# Patient Record
Sex: Male | Born: 2000
Health system: Southern US, Community
[De-identification: ages and names within clinical notes are randomized; demographics above are authoritative.]

---
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
## 2021-11-27 IMAGING — CT CT NECK WO/W CM
3 of 12 series · 10 of 34 positions shown, 11 images · IV contrast (omnipaque)
Comparison: None.
COMPARISON: None.

Addendum:
CLINICAL DATA: Fever.  Neck pain.  Assess for infection.

EXAM:
CT NECK WITH AND WITHOUT CONTRAST
TECHNIQUE: Multidetector CT imaging of the neck was performed without and with
intravenous contrast.
CONTRAST:  75mL OMNIPAQUE IOHEXOL 300 MG/ML  SOLN

[Series 7: sagittal · sagittal · 0.53mm/px · 6 of 101 slices shown]
[im 16/101  soft-tissue]
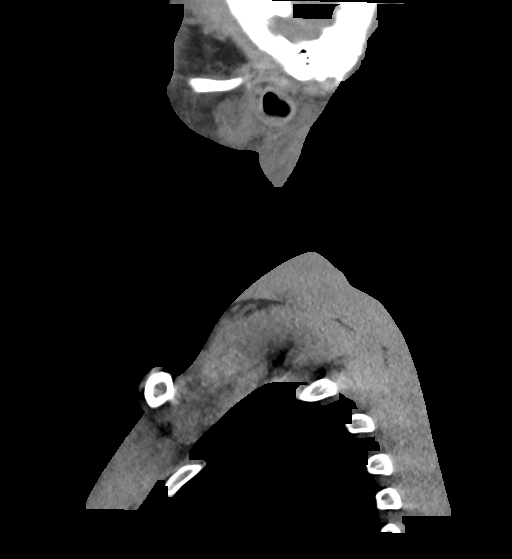
[im 17/101  bone]
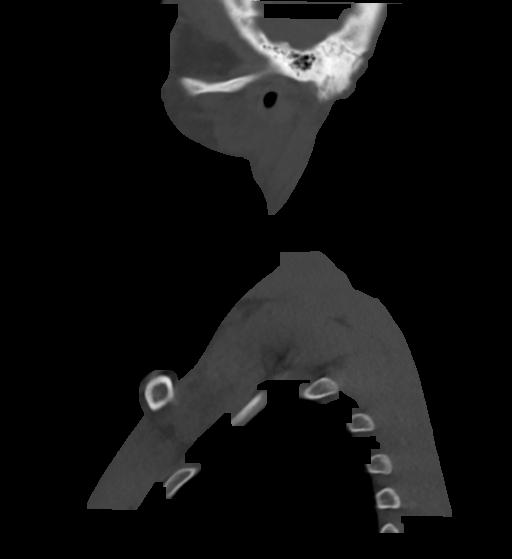
[im 34/101  bone]
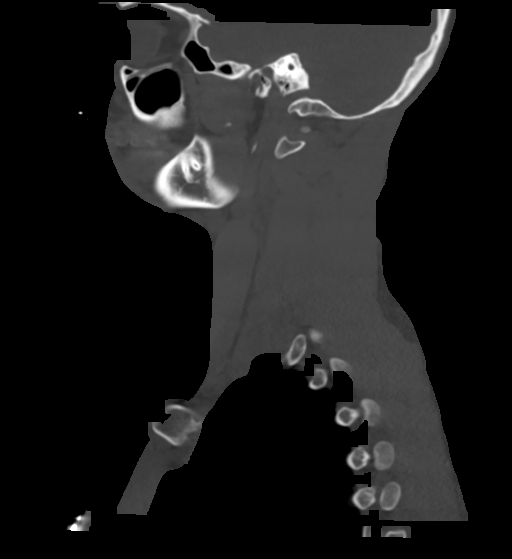
[im 51/101  bone]
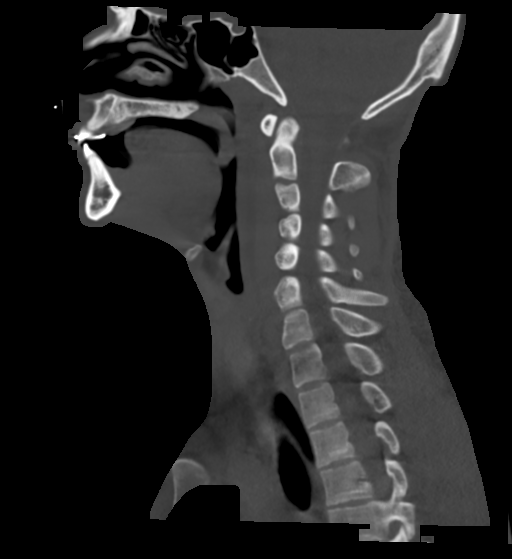
[im 67/101  bone]
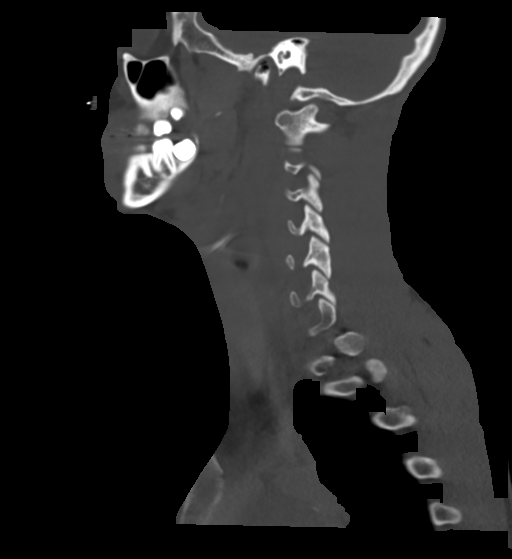
[im 84/101  bone]
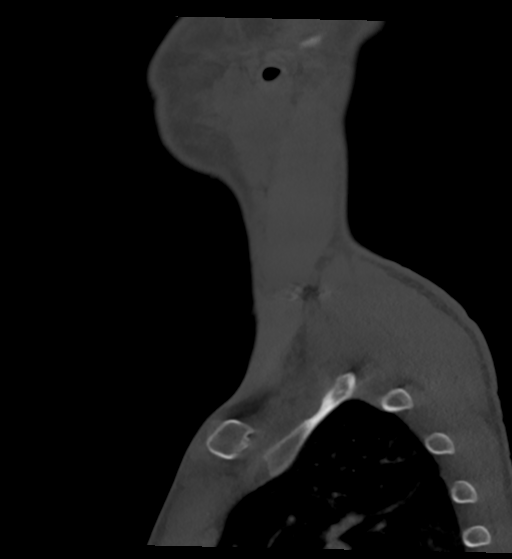

[Series 8: coronal · coronal · 0.39mm/px · 1 of 135 slices shown]
[im 68/135  bone]
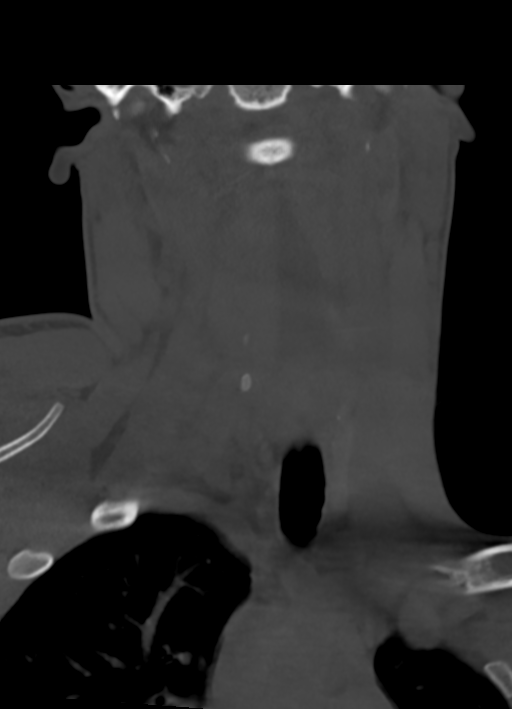

[Series 9: orthogonal · axial · 0.39mm/px · z∈[-398,-104]mm · 3 of 148 slices shown, 4 images]
[im 1/148  soft-tissue]
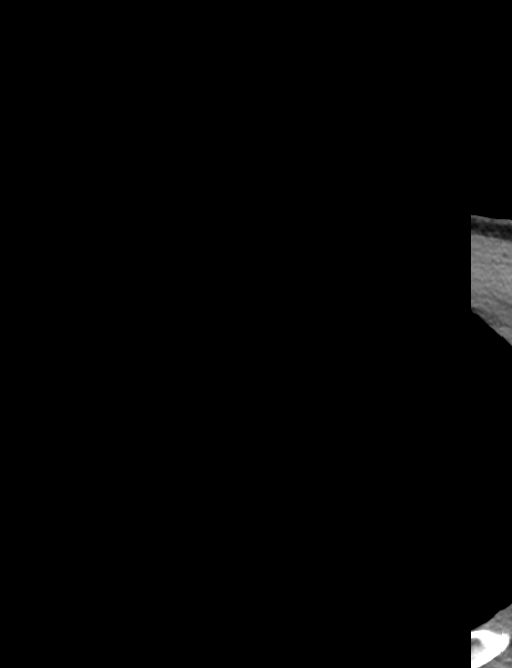
[im 1/148  bone]
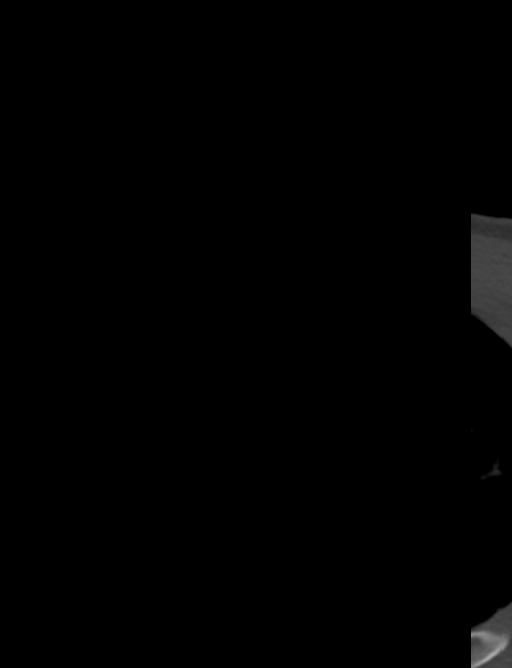
[im 74/148  bone]
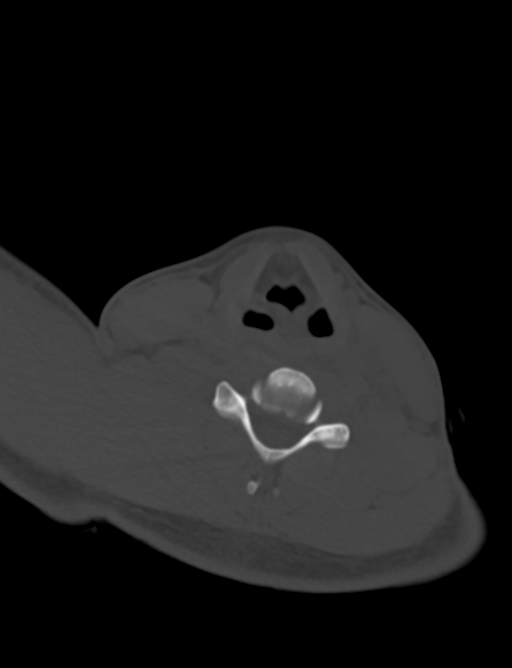
[im 148/148  bone]
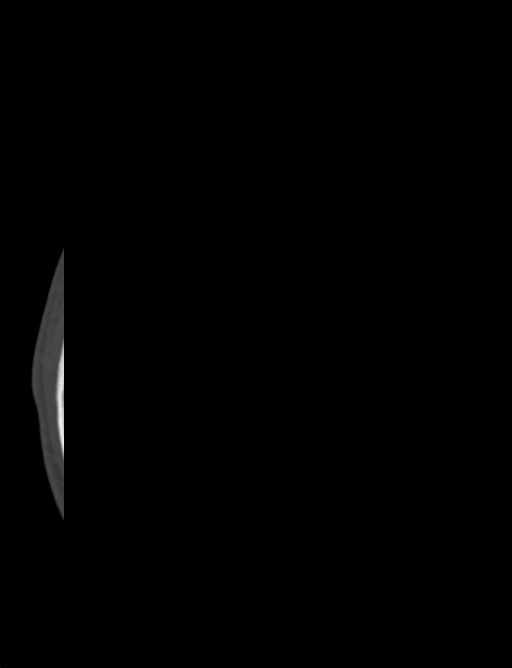

[10 of 34 positions shown; findings below may reference images not displayed]

FINDINGS: Pharynx and larynx: Pharyngitis with retropharyngeal/prevertebral
effusion, possibly pus. No focal collection elsewhere.

Salivary glands: Parotid and submandibular glands are normal.

Thyroid: Normal

Lymph nodes: Mild reactive nodal prominence. No significant
lymphadenopathy.

Vascular: Negative

Limited intracranial: Negative

Visualized orbits: Negative

Mastoids and visualized paranasal sinuses: Clear

Skeleton: Negative

Upper chest: Normal

Other: None
IMPRESSION: Pharyngitis with prevertebral/retropharyngeal effusion, presumably
pus consistent with retropharyngeal abscess formation.

ADDENDUM:
Retropharyngeal fluid collection extends over a length of 8 cm and
has a maximal thickness of 9 mm.

*** End of Addendum ***
FINDINGS: Pharynx and larynx: Pharyngitis with retropharyngeal/prevertebral
effusion, possibly pus. No focal collection elsewhere.

Salivary glands: Parotid and submandibular glands are normal.

Thyroid: Normal

Lymph nodes: Mild reactive nodal prominence. No significant
lymphadenopathy.

Vascular: Negative

Limited intracranial: Negative

Visualized orbits: Negative

Mastoids and visualized paranasal sinuses: Clear

Skeleton: Negative

Upper chest: Normal

Other: None
IMPRESSION: Pharyngitis with prevertebral/retropharyngeal effusion, presumably
pus consistent with retropharyngeal abscess formation.

## 2023-03-30 NOTE — Progress Notes (Signed)
 See Scan note in Media

## 2024-04-30 NOTE — Progress Notes (Signed)
  ANNUAL EXAM  Gary Rojas is a 23 y.o. male  CHIEF COMPLAINT: Chief Complaint  Patient presents with  . New Patient    SUBJECTIVE: 23 year old male presents for evaluation.  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  No complaints.  Has minimal hives on occasion ______________________________________________________________________ A comprehensive ROS was negative in all 10 systems reviewed.  ALLERGIES: Patient has no known allergies.  No past medical history on file.  History reviewed. No pertinent surgical history.  No current outpatient medications on file.   No current facility-administered medications for this visit.    PHYSICAL EXAM: BP 120/70   Pulse 94   Wt 97.2 kg (214 lb 3.2 oz)   SpO2 97%   There is no height or weight on file to calculate BMI.  Wt Readings from Last 2 Encounters:  04/30/24 97.2 kg (214 lb 3.2 oz)  11/14/17 79.8 kg (176 lb) (96%, Z= 1.73)*   * Growth percentiles are based on CDC (Boys, 2-20 Years) data.    BP Readings from Last 2 Encounters:  04/30/24 120/70  11/14/17 106/62 (24%, Z = -0.71 /  34%, Z = -0.41)*   *BP percentiles are based on the 2017 AAP Clinical Practice Guideline for boys    General. Alert oriented x3  Skin. No suspicious lesions or moles on trunk or abdomen Eyes. Sclera and conjunctiva clear; pupils equal round and reactive to light and accommodation; extraocular movements intact Ears. External normal; canals clear; tympanic membranes normal Nose. Mucosa healthy without drainage or ulceration Oropharynx. No suspicious lesions Neck. No swelling, masses, stiffness, pain, limited movement, carotid pulses normal bilaterally, thyroid normal size, no masses palpated.  No bruits Breasts. No masses noted Lungs. Respirations unlabored; clear to auscultation bilaterally Back. No spinal deformity Cardiovascular. Heart regular rate and rhythm without murmurs, gallops, or  rubs Abdomen. Soft; non tender; non distended; normoactive bowel sounds; no masses or organomegaly Lymph Nodes. No significant cervical, supraclavicular, axillary or inguinal lymphadenopathy noted Musculoskeletal. No deformities; no active joint inflammation Extremities. Normal, no edema Pulses. Dorsalis pedis palpable and symmetric bilaterally Neurologic. Alert and oriented X3; speech intact; face symmetrical; moves all extremities well Rectal exam:  No masses noted .     ASSESSMENT/PLAN:  Periodic hives-taking Benadryl at night, will take Claritin in the morning, could be from stress, could be related to some of the hydraulic fluid, no rash seen Overall healthy, labs pending, follow-up 2 years   Goals Addressed               This Visit's Progress   . * Patient Goals (pt-stated)        AP license.        .[email or web address]  Return in about 2 years (around 04/30/2026) for physical.               *Some images could not be shown.

## 2024-11-10 ENCOUNTER — Inpatient Hospital Stay (HOSPITAL_COMMUNITY)
Admission: EM | Admit: 2024-11-10 | Discharge: 2024-11-14 | DRG: 896 | Disposition: A | Discharge status: Other Acute Inpatient Hospital | Attending: Family Medicine | Admitting: Family Medicine

## 2024-11-10 ENCOUNTER — Emergency Department (HOSPITAL_COMMUNITY)

## 2024-11-10 ENCOUNTER — Other Ambulatory Visit: Payer: Self-pay

## 2024-11-10 ENCOUNTER — Encounter (HOSPITAL_COMMUNITY): Payer: Self-pay | Admitting: Internal Medicine

## 2024-11-10 DIAGNOSIS — F1290 Cannabis use, unspecified, uncomplicated: Secondary | ICD-10-CM

## 2024-11-10 DIAGNOSIS — F1210 Cannabis abuse, uncomplicated: Secondary | ICD-10-CM | POA: Diagnosis not present

## 2024-11-10 DIAGNOSIS — R569 Unspecified convulsions: Secondary | ICD-10-CM | POA: Diagnosis not present

## 2024-11-10 DIAGNOSIS — F12159 Cannabis abuse with psychotic disorder, unspecified: Principal | ICD-10-CM | POA: Diagnosis present

## 2024-11-10 DIAGNOSIS — A86 Unspecified viral encephalitis: Secondary | ICD-10-CM | POA: Diagnosis not present

## 2024-11-10 DIAGNOSIS — F23 Brief psychotic disorder: Secondary | ICD-10-CM | POA: Diagnosis not present

## 2024-11-10 DIAGNOSIS — E538 Deficiency of other specified B group vitamins: Secondary | ICD-10-CM | POA: Diagnosis not present

## 2024-11-10 DIAGNOSIS — G9341 Metabolic encephalopathy: Secondary | ICD-10-CM | POA: Diagnosis present

## 2024-11-10 DIAGNOSIS — R4182 Altered mental status, unspecified: Secondary | ICD-10-CM | POA: Diagnosis not present

## 2024-11-10 DIAGNOSIS — D72829 Elevated white blood cell count, unspecified: Secondary | ICD-10-CM | POA: Diagnosis present

## 2024-11-10 DIAGNOSIS — E860 Dehydration: Secondary | ICD-10-CM | POA: Diagnosis present

## 2024-11-10 DIAGNOSIS — Z743 Need for continuous supervision: Secondary | ICD-10-CM | POA: Diagnosis not present

## 2024-11-10 DIAGNOSIS — F1090 Alcohol use, unspecified, uncomplicated: Secondary | ICD-10-CM | POA: Diagnosis not present

## 2024-11-10 DIAGNOSIS — G0481 Other encephalitis and encephalomyelitis: Secondary | ICD-10-CM

## 2024-11-10 DIAGNOSIS — R404 Transient alteration of awareness: Secondary | ICD-10-CM | POA: Diagnosis not present

## 2024-11-10 DIAGNOSIS — G9340 Encephalopathy, unspecified: Principal | ICD-10-CM

## 2024-11-10 DIAGNOSIS — Z87820 Personal history of traumatic brain injury: Secondary | ICD-10-CM

## 2024-11-10 DIAGNOSIS — E8809 Other disorders of plasma-protein metabolism, not elsewhere classified: Secondary | ICD-10-CM | POA: Diagnosis present

## 2024-11-10 DIAGNOSIS — F99 Mental disorder, not otherwise specified: Secondary | ICD-10-CM | POA: Diagnosis not present

## 2024-11-10 DIAGNOSIS — R531 Weakness: Secondary | ICD-10-CM | POA: Diagnosis not present

## 2024-11-10 DIAGNOSIS — F1010 Alcohol abuse, uncomplicated: Secondary | ICD-10-CM | POA: Diagnosis present

## 2024-11-10 LAB — COMPREHENSIVE METABOLIC PANEL WITH GFR
ALT: 22 U/L (ref 0–44)
AST: 26 U/L (ref 15–41)
Albumin: 5.3 g/dL — ABNORMAL HIGH (ref 3.5–5.0)
Alkaline Phosphatase: 97 U/L (ref 38–126)
Anion gap: 12 (ref 5–15)
BUN: 9 mg/dL (ref 6–20)
CO2: 25 mmol/L (ref 22–32)
Calcium: 10.9 mg/dL — ABNORMAL HIGH (ref 8.9–10.3)
Chloride: 100 mmol/L (ref 98–111)
Creatinine, Ser: 1.08 mg/dL (ref 0.61–1.24)
GFR, Estimated: >60 mL/min (ref >60)
Glucose, Bld: 108 mg/dL — ABNORMAL HIGH (ref 70–99)
Potassium: 4.6 mmol/L (ref 3.5–5.1)
Sodium: 136 mmol/L (ref 135–145)
Total Bilirubin: 0.7 mg/dL (ref 0.0–1.2)
Total Protein: 8.6 g/dL — ABNORMAL HIGH (ref 6.5–8.1)

## 2024-11-10 LAB — URINE DRUG SCREEN
Amphetamines: NEGATIVE
Barbiturates: NEGATIVE
Benzodiazepines: NEGATIVE
Cocaine: NEGATIVE
Fentanyl: NEGATIVE
Methadone Scn, Ur: NEGATIVE
Opiates: NEGATIVE
Tetrahydrocannabinol: POSITIVE — AB

## 2024-11-10 LAB — CBC
HCT: 49.2 % (ref 39.0–52.0)
Hemoglobin: 16.3 g/dL (ref 13.0–17.0)
MCH: 27.8 pg (ref 26.0–34.0)
MCHC: 33.1 g/dL (ref 30.0–36.0)
MCV: 83.8 fL (ref 80.0–100.0)
Platelets: 378 K/uL (ref 150–400)
RBC: 5.87 MIL/uL — ABNORMAL HIGH (ref 4.22–5.81)
RDW: 12.9 % (ref 11.5–15.5)
WBC: 16.5 K/uL — ABNORMAL HIGH (ref 4.0–10.5)
nRBC: 0 % (ref 0.0–0.2)

## 2024-11-10 LAB — ETHANOL: Alcohol, Ethyl (B): <15 mg/dL (ref <15)

## 2024-11-10 LAB — VITAMIN B12: Vitamin B-12: 515 pg/mL (ref 180–914)

## 2024-11-10 LAB — SALICYLATE LEVEL: Salicylate Lvl: <7 mg/dL — ABNORMAL LOW (ref 7.0–30.0)

## 2024-11-10 LAB — ACETAMINOPHEN LEVEL: Acetaminophen (Tylenol), Serum: <10 ug/mL — ABNORMAL LOW (ref 10–30)

## 2024-11-10 MED ORDER — FENTANYL CITRATE (PF) 50 MCG/ML IJ SOSY
50.0000 ug | PREFILLED_SYRINGE | Freq: Once | INTRAMUSCULAR | Status: AC
  Administered 2024-11-10: 50 ug via INTRAVENOUS
  Filled 2024-11-10: qty 1

## 2024-11-10 MED ORDER — SODIUM CHLORIDE 0.9 % IV SOLN
2.0000 g | Freq: Once | INTRAVENOUS | Status: AC
  Administered 2024-11-10: 2 g via INTRAVENOUS
  Filled 2024-11-10: qty 20

## 2024-11-10 MED ORDER — VANCOMYCIN HCL 1750 MG/350ML IV SOLN
1750.0000 mg | Freq: Once | INTRAVENOUS | Status: AC
  Administered 2024-11-10: 1750 mg via INTRAVENOUS
  Filled 2024-11-10: qty 350

## 2024-11-10 MED ORDER — ONDANSETRON HCL 4 MG/2ML IJ SOLN: 4.0000 mg | Freq: Four times a day (QID) | INTRAMUSCULAR | Status: DC | PRN

## 2024-11-10 MED ORDER — MIDAZOLAM HCL (PF) 2 MG/2ML IJ SOLN
2.0000 mg | Freq: Once | INTRAMUSCULAR | Status: AC
  Administered 2024-11-10: 2 mg via INTRAVENOUS
  Filled 2024-11-10: qty 2

## 2024-11-10 MED ORDER — ACETAMINOPHEN 650 MG RE SUPP: 650.0000 mg | Freq: Four times a day (QID) | RECTAL | Status: DC | PRN

## 2024-11-10 MED ORDER — VANCOMYCIN HCL IN DEXTROSE 1-5 GM/200ML-% IV SOLN
1000.0000 mg | Freq: Three times a day (TID) | INTRAVENOUS | Status: DC
  Administered 2024-11-10 – 2024-11-11 (×3): 1000 mg via INTRAVENOUS
  Filled 2024-11-10 (×3): qty 200

## 2024-11-10 MED ORDER — DEXTROSE 5 % IV SOLN
10.0000 mg/kg | Freq: Three times a day (TID) | INTRAVENOUS | Status: DC
  Administered 2024-11-10 – 2024-11-11 (×3): 860 mg via INTRAVENOUS
  Filled 2024-11-10 (×7): qty 17.2

## 2024-11-10 MED ORDER — GADOBUTROL 1 MMOL/ML IV SOLN
7.0000 mL | Freq: Once | INTRAVENOUS | Status: AC | PRN
  Administered 2024-11-10: 7 mL via INTRAVENOUS

## 2024-11-10 MED ORDER — LIDOCAINE HCL 2 % IJ SOLN
10.0000 mL | Freq: Once | INTRAMUSCULAR | Status: AC
  Administered 2024-11-10: 200 mg via INTRADERMAL
  Filled 2024-11-10: qty 20

## 2024-11-10 MED ORDER — ONDANSETRON HCL 4 MG PO TABS: 4.0000 mg | ORAL_TABLET | Freq: Four times a day (QID) | ORAL | Status: DC | PRN

## 2024-11-10 MED ORDER — VANCOMYCIN HCL IN DEXTROSE 1-5 GM/200ML-% IV SOLN: 1000.0000 mg | Freq: Once | INTRAVENOUS | Status: DC

## 2024-11-10 MED ORDER — SODIUM CHLORIDE 0.9 % IV SOLN
2.0000 g | Freq: Two times a day (BID) | INTRAVENOUS | Status: DC
  Administered 2024-11-11 (×2): 2 g via INTRAVENOUS
  Filled 2024-11-10 (×2): qty 20

## 2024-11-10 MED ORDER — ACETAMINOPHEN 325 MG PO TABS
650.0000 mg | ORAL_TABLET | Freq: Four times a day (QID) | ORAL | Status: DC | PRN
  Administered 2024-11-11 – 2024-11-13 (×3): 650 mg via ORAL
  Filled 2024-11-10 (×4): qty 2

## 2024-11-10 MED ORDER — LACTATED RINGERS IV BOLUS
1000.0000 mL | Freq: Once | INTRAVENOUS | Status: AC
  Administered 2024-11-10: 1000 mL via INTRAVENOUS

## 2024-11-10 MED ORDER — LACTATED RINGERS IV SOLN: INTRAVENOUS | Status: DC

## 2024-11-10 MED ORDER — LIDOCAINE-EPINEPHRINE-TETRACAINE (LET) TOPICAL GEL
3.0000 mL | Freq: Once | TOPICAL | Status: AC
  Administered 2024-11-10: 3 mL via TOPICAL
  Filled 2024-11-10: qty 3

## 2024-11-10 NOTE — Consult Note (Incomplete)
 NEUROLOGY CONSULT NOTE   Date of service: November 10, 2024 Patient Name: Gary Rojas MRN:  [id number] DOB:  [birth year] Chief Complaint: confusion Requesting Provider: Celinda Alm Lot, MD  History of Present Illness  Gary Rojas is a 23 y.o. male with hx of multiple concussions while playing football, who is brought in with confusion. Family worried about substance use. Patient noted to have leukocytosis.  CT head and MRI brain w + w/o c with no significant abnormalities. LP x 2 attempted at the bedside and failed.  [facility] was transferred to [facility] for [facility]y evaluation.  Patient has a very poor insight into what is going on.  Mother is at the bedside and provides most of the history.  Mother reports that he was in [place]  over the weekend and flew back in Monday to get back to work.  He was in flight all day Monday due to delays with flights due to government closure.  He got here at 1 AM Tuesday morning.  She noticed that when she went in to pick him up while at [facility] airport, he was having difficulty staying still and following basic directions while in the airport pickup line.  Tuesday, he was late to work. She talked to him on the phone Tuesday and Wednesday and did not think too much about it.  Thursday, he got into a verbal altercation with his supervisor and was upset. Friday he was a no call, no show but mother who looked at the life 360 app, reports that he drove to work twice but did not actually go inside. Saturday, when he got to work, he was walking around in circles at work and noted to have trouble following directions. He was sent to the ED.  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  Patient has significant bradyphrenia, appears to have poor insight into what is going on and history is almost exclusively provided by mother at the bedside.  Just moved to his own place 3 weeks aog and  prior to that, was staying with his parents. Eats mostly fast food. He had been sweating and felt clamy over the last week. No respiratory symptoms. No cough. Pt does endorse to using marijuana and drinking EtOH.    ROS  Unable to ascertain due to bradyphrenia. Denies any pain.  Past History  History reviewed. No pertinent past medical history.  History reviewed. No pertinent surgical history.  Family History: History reviewed. No pertinent family history.  Social History  reports that he has never smoked. He has never used smokeless tobacco. He reports that he does not drink alcohol and does not use drugs.  No Known Allergies  Medications   Current Facility-Administered Medications:    acetaminophen  (TYLENOL ) tablet 650 mg, 650 mg, Oral, Q6H PRN **OR** acetaminophen  (TYLENOL ) suppository 650 mg, 650 mg, Rectal, Q6H PRN, Celinda Alm Lot, MD   acyclovir (ZOVIRAX) 860 mg in dextrose  5 % 250 mL IVPB, 10 mg/kg, Intravenous, Q8H, Nicholaus Quarry, RPH, Stopped at 11/10/24 1947   [START ON 11/11/2024] cefTRIAXone  (ROCEPHIN ) 2 g in sodium chloride  0.9 % 100 mL IVPB, 2 g, Intravenous, Q12H, Celinda Alm Lot, MD   lactated ringers infusion, , Intravenous, Continuous, Nicholaus Quarry, [facility], Last Rate: 125 mL/hr at 11/10/24 1702, New Bag at 11/10/24 1702   ondansetron  (ZOFRAN ) tablet 4 mg, 4 mg, Oral, Q6H PRN **OR** ondansetron  (ZOFRAN ) injection 4 mg, 4 mg, Intravenous, Q6H PRN, Celinda Alm  Dorn, MD   NOREEN ON 11/11/2024] vancomycin (VANCOCIN) IVPB 1000 mg/200 mL premix, 1,000 mg, Intravenous, Q8H, Nicholaus Quarry, [place]  Vitals   Vitals:   November 25, 2024 1600 2024-11-25 1748 11/25/24 1943 11/25/2024 2046  BP:  (!) 147/95  (!) 145/95  Pulse:  96  78  Resp:  16  16  Temp:   98.5 F (36.9 C) 98.3 F (36.8 C)  TempSrc:   Oral   SpO2:  98%  100%  Weight: 88 kg     Height: 6' (1.829 m)       Body mass index is 26.3 kg/m.   Physical Exam   General: Laying comfortably in bed; in no acute  distress.  HENT: Normal oropharynx and mucosa. Normal external appearance of ears and nose.  Neck: Supple, no pain or tenderness  CV: No JVD. No peripheral edema.  Pulmonary: Symmetric Chest rise. Normal respiratory effort.  Abdomen: Soft to touch, non-tender.  Ext: No cyanosis, edema, or deformity  Skin: No rash. Normal palpation of skin.   Musculoskeletal: Normal digits and nails by inspection. No clubbing.   Neurologic Examination  Mental status/Cognition: Alert, significant bradyphrenia and takes 30 secs just to tell me his name and age and requires me to repeat a couple times before he will answer. oriented to self, place, month and year, poor attention. Speech/language: Fluent, comprehension intact, object naming intact, repetition intact.  Cranial nerves:   CN II Pupils equal and reactive to light, no VF deficits    CN III,IV,VI EOM intact, no gaze preference or deviation, no nystagmus    CN V normal sensation in V1, V2, and V3 segments bilaterally    CN VII no asymmetry, no nasolabial fold flattening    CN VIII normal hearing to speech    CN IX & X normal palatal elevation, no uvular deviation    CN XI 5/5 head turn and 5/5 shoulder shrug bilaterally    CN XII midline tongue protrusion    Motor:  Muscle bulk: normal, tone normoal, pronator drift none tremor none Mvmt Root Nerve  Muscle Right Left Comments  SA C5/6 Ax Deltoid 5 5   EF C5/6 Mc Biceps 5 5   EE C6/7/8 Rad Triceps 5 5   WF C6/7 Med FCR     WE C7/8 PIN ECU     F Ab C8/T1 U ADM/FDI 5 5   HF L1/2/3 Fem Illopsoas 5 5   KE L2/3/4 Fem Quad 5 5   DF L4/5 D Peron Tib Ant 5 5   PF S1/2 Tibial Grc/Sol 5 5    Sensation:  Light touch Intact throughout   Pin prick    Temperature    Vibration   Proprioception    Coordination/Complex Motor:  - Finger to Nose intact BL - Heel to shin intact BL - Rapid alternating movement are normal - Gait: Stride length normal. Arm swing poor but holding onto IV pole. Base width  narrow.  Labs/Imaging/Neurodiagnostic studies   CBC:  Recent Labs  Lab 11/25/24 1041  WBC 16.5*  HGB 16.3  HCT 49.2  MCV 83.8  PLT 378   Basic Metabolic Panel:  Lab Results  Component Value Date   NA 136 11/25/24   K 4.6 11-25-24   CO2 25 2024-11-25   GLUCOSE 108 (H) 11/25/2024   BUN 9 11/25/2024   CREATININE 1.08 2024/11/25   CALCIUM 10.9 (H) Nov 25, 2024   GFRNONAA >60 November 25, 2024   GFRAA NOT CALCULATED 03/13/2020   Lipid Panel: No  results found for: LDLCALC HgbA1c: No results found for: HGBA1C Urine Drug Screen:     Component Value Date/Time   LABOPIA NEGATIVE 11/10/2024 1140   COCAINSCRNUR NEGATIVE 11/10/2024 1140   LABBENZ NEGATIVE 11/10/2024 1140   AMPHETMU NEGATIVE 11/10/2024 1140   THCU POSITIVE (A) 11/10/2024 1140   LABBARB NEGATIVE 11/10/2024 1140    Alcohol Level     Component Value Date/Time   ETH <15 11/10/2024 1041   INR No results found for: INR APTT No results found for: APTT AED levels: No results found for: PHENYTOIN, ZONISAMIDE, LAMOTRIGINE, LEVETIRACETA  CT Head without contrast(Personally reviewed): CTH was negative for a large hypodensity concerning for a large territory infarct or hyperdensity concerning for an ICH  MRI Brain(Personally reviewed): No acute abnormalities  Neurodiagnostics cEEG:  pending  ASSESSMENT   Mikael Skoda is a 23 y.o. male p/w one week of progressive poor attention, bradyphrenia and poor insight. Exam otherwise is non focal. He is awake and alert othewise. Has also been sweating and warm to touch over the last week. There is no nuchal rigidity and negative kernigs and brudzinskis sign.  There is history of marijuana and EtOh use along with poor eating habits.  Differential is pretty broad. I dont think THC use and EtOH alone explains his current presentation. Suspect potential viral or autoimmune encephalitis. Would expect this to be a aubacute decline if this was thiamine of B12  deficiency but is on the differential.   RECOMMENDATIONS  - Decline has been significant over the last week and I would recommend empiric meningitis coverage. If the preliminary CSF is negative for infection, recommend treating with pulse dose steroids and IVIG. - agree with empiric meningitis coverage with Vanc, Ceftriaxone (meningitis dosing) and acyclovir. - B12, folate, thiamine, RPR, HIV, Ammonia. - Thiamine wernickes dosing once levels are collected. - Co-oximetry panel to look for CO poisoning, heavy metals(? environmental exposure due to work) - cEEG in AM. - LP with CSF Cell count, differential, protein and glucose, CSF gram stain and cultures, CSF meningitis/encephalitis panel, EBV and CMV PCR on CSF, CSF and serum Autoimmune encephalopathy panel, oligoclonal bands and IgG index. ______________________________________________________________________  We will continue to follow along.  Plan discussed with patient and mother at the bedisde. Plan also discussed with Dr. Charlton with the Hospitalist team.   I personally spent a total of 75 minutes in the care of the patient today including preparing to see the patient, getting/reviewing separately obtained history, performing a medically appropriate exam/evaluation, counseling and educating, placing orders, referring and communicating with other health care professionals, documenting clinical information in the EHR, independently interpreting results, communicating results, and coordinating care.   Signed, Elizaveta Mattice, MD [facility]

## 2024-11-10 NOTE — ED Notes (Signed)
Pt gone for MRI.  

## 2024-11-10 NOTE — ED Triage Notes (Signed)
 Pt is alert and oriented but answering questions very slowly, if at all. Making eye contact. Does not know why he is here. Family is on the way, suspects he is doing drugs.

## 2024-11-10 NOTE — H&P (Signed)
 History and Physical    Patient: Gary Rojas FMW:[id number] DOB: 12-Jan-2001 DOA: 11/10/2024 DOS: the patient was seen and examined on 11/10/2024 PCP: Cleotilde Oneil FALCON, MD  Patient coming from: Home  Chief Complaint:  Chief Complaint  Patient presents with   Medical Clearance   HPI: Gary Rojas is a 23 y.o. male with medical history significant of multiple head concussions while playing football who was brought to the emergency department by [facility] due to altered mental status.  His family has been suspicious that he has been using drugs and alcohol.  The patient subsequently stated that he needed help with.  No apparent history of trauma.  He may be stressed as he recently moved out of his parents house to an apartment in [place], [place].  He is unable to elaborate much on HPI, he has some mild headache, but no chest, back or abdominal pain.  Lab work: UDS was positive for THC.  CBC showed a white count of 16.5, hemoglobin 16.3 g/dL platelets 621.  Unremarkable acetaminophen  salicylate and alcohol levels.  B12 was 515 pg/mL.  CMP showed a glucose of 108 and calcium of 10.9 mg/dL.  Total protein is 8.6 and albumin 5.3 g/dL.  Renal function, the rest of the electrolytes and the rest of the LFTs were normal.  Imaging: CT head without contrast with no acute intracranial normality.  MRI brain without contrast no acute intracranial normality.  No mass or abnormal enhancement.   ED course: Initial vital signs were temperature 98.2 F, pulse 114, respiration 14, BP 154/87 mmHg O2 sat 97% on room air.  The patient received fentanyl 50 mcg IVP x 2, midazolam 2 mg IVP x 2, ceftriaxone  2 g IVPB and was started on vancomycin.  Neurology was consulted.  They requested for the patient to go to [facility].  Review of Systems: As mentioned in the history of present illness. All other systems reviewed and are negative.  Past medical history as above. No past surgical history on  file.  Social History: Unspecified amounts of alcohol and cannabis use.  No Known Allergies  No family history on file.  Prior to Admission medications   Medication Sig Start Date End Date Taking? Authorizing Provider  acetaminophen  (TYLENOL ) 325 MG tablet Take 2 tablets (650 mg total) by mouth every 6 (six) hours as needed for fever or headache. 03/14/20   Whiteis, Bernarda, MD  ibuprofen  (ADVIL ) 400 MG tablet Take 1 tablet (400 mg total) by mouth every 6 (six) hours as needed for fever, headache or mild pain. 03/14/20   Catheryn Bernarda, MD    Physical Exam: Vitals:   11/10/24 1004 11/10/24 1047 11/10/24 1357 11/10/24 1550  BP: (!) 154/87  (!) 114/98 116/64  Pulse: (!) 114  (!) 107 92  Resp: 14  16 16   Temp: 98.2 F (36.8 C)  98.9 F (37.2 C) 98.3 F (36.8 C)  TempSrc: Oral  Oral Oral  SpO2: 97% 97% 98% 99%   Physical Exam Vitals and nursing note reviewed.  Constitutional:      Appearance: He is ill-appearing.  HENT:     Head: Normocephalic.     Nose: No rhinorrhea.     Mouth/Throat:     Mouth: Mucous membranes are dry.     Comments: Left upper and left lower lips are edematous. (The patient has been chewing on the area since this morning). Eyes:     General: No scleral icterus.    Pupils: Pupils are equal,  round, and reactive to light.  Pulmonary:     Effort: Pulmonary effort is normal.     Breath sounds: Normal breath sounds.  Abdominal:     General: Bowel sounds are normal.     Palpations: Abdomen is soft.  Musculoskeletal:     Cervical back: Neck supple.     Right lower leg: No edema.     Left lower leg: No edema.  Skin:    General: Skin is warm and dry.  Neurological:     General: No focal deficit present.     Mental Status: He is alert. He is disoriented.  Psychiatric:        Speech: Speech is delayed.        Behavior: Behavior is slowed.        Thought Content: Thought content is paranoid.        Cognition and Memory: He exhibits impaired recent memory.      Comments: Inattentive at times.     Data Reviewed:  Results are pending, will review when available. EKG: Vent. rate 91 BPM PR interval 132 ms QRS duration 92 ms QT/QTcB 338/415 ms P-R-T axes 78 -62 33 Sinus rhythm with marked sinus arrhythmia Left anterior fascicular block Abnormal ECG  Assessment and Plan: Principal Problem:   Altered mental status In the setting of:   Acute metabolic encephalopathy Observation/telemetry. Frequent neurochecks. Check echocardiogram. Normal CT head. Normal MRI of brain. Stroke team input appreciated. Will need EEG and further workup. - Will transfer to [facility].  Active Problems:   Hypercalcemia Secondary to volume depletion. Continue IV fluids. Follow calcium level in AM.    Hyperproteinemia Secondary to volume depletion. Continue IV hydration.    Cannabis abuse Could not remember strength of THC gummy. He stated he wants help with this.    Alcohol abuse Unknown amount. Monitor closely for any withdrawal symptoms. Begin CIWA protocol as needed.    Advance Care Planning:   Code Status: Full Code   Consults: Neurohospitalist team.  Family Communication: His mother and maternal grandmother were at bedside.  Severity of Illness: The appropriate patient status for this patient is INPATIENT. Inpatient status is judged to be reasonable and necessary in order to provide the required intensity of service to ensure the patient's safety. The patient's presenting symptoms, physical exam findings, and initial radiographic and laboratory data in the context of their chronic comorbidities is felt to place them at high risk for further clinical deterioration. Furthermore, it is not anticipated that the patient will be medically stable for discharge from the hospital within 2 midnights of admission.   * I certify that at the point of admission it is my clinical judgment that the patient will require inpatient hospital care  spanning beyond 2 midnights from the point of admission due to high intensity of service, high risk for further deterioration and high frequency of surveillance required.*  Author: Alm Dorn Castor, MD 11/10/2024 4:29 PM  For on call review [email or web address].   This document was prepared using Dragon voice recognition software and may contain some unintended transcription errors.

## 2024-11-10 NOTE — Progress Notes (Addendum)
 Pharmacy Antibiotic Note  Gary Rojas is a 23 y.o. male admitted on 11/10/2024 with altered mental status, leukocytosis, concern for possible meningitis/herpes encephalitis.  Pharmacy has been consulted for IV acyclovir dosing.  Patient currently in MRI and altered, so unable to provide weight. Discussed with paramedic and per patient's mother, patient weighs ~190 lbs. Scr stable at 1.08.   Plan: - Start IV acyclovir 10mg /kg q8hrs - Start LR @125  ml/hr  - BMP daily x3 days to assess for crystaluria with IV acyclovir  - Monitor cultures, renal function, and overall clinical picture  - De-escalate abx as able    Temp (24hrs), Avg:98.5 F (36.9 C), Min:98.2 F (36.8 C), Max:98.9 F (37.2 C)  Recent Labs  Lab 11/10/24 1041  WBC 16.5*  CREATININE 1.08    CrCl cannot be calculated (Unknown ideal weight.).    No Known Allergies  Antimicrobials this admission: 11/15 Ceftriaxone  2g IV x1 (not yet given) 11/15 Vancomycin 1750mg  IV x1 (not yet given) 11/15 IV acyclovir >>   Dose adjustments this admission: N/A  Microbiology results: 11/15 gram stain: not yet collected 11/15 CSF culture: not yet collected   Thank you for allowing pharmacy to be a part of this patient's care.  Lacinda Moats, PharmD Clinical Pharmacist  11/15/20254:32 PM   ADDENDUM: Pharmacy now consulted for vancomycin dosing in the setting of possible meningitis. Patient received IV vancomycin 1750mg  x1 while in the ED @1710 . Given meningitis indication, will dose vanc based on the dosing nomogram (not AUC dosing). Plan: Start vancomycin 1000mg  IV q8hrs (based on weight 88kg, CrCl >100 ml/min, and goal trough 15-20) Vanc levels PRN Continue to monitor cultures, renal function De-escalate abx as able

## 2024-11-10 NOTE — ED Notes (Signed)
 Writer spoke with pt extensively about providing urine sample. Pt would just stare at writer with a blank expression. At first writer thought pt doesn't speak English, but he confirmed he does speak English. Writer took pt to bathroom and asked him to provide urine sample. Pt brought writer an empty cup with no top. Pt seems to be very confused with directions.

## 2024-11-10 NOTE — Plan of Care (Signed)

## 2024-11-10 NOTE — ED Provider Notes (Signed)
 [facility] AT [facility] Provider Note   CSN: [id number] Arrival date & time: 11/10/24  9044     Patient presents with: Medical Clearance   Gary Rojas is a 23 y.o. male.   The history is provided by the patient, the police and medical records. No language interpreter was used.     23 year old male brought in accompanied by [employer] for altered mental status.  Per triage note, patient was brought here by [employer].  He does not know why he is here.  Family is on the way and they suspect that he is doing drugs.  I have a very difficult time obtaining history from patient.  He denies having any active pain such as headache chest pain trouble breathing abdominal pain nausea vomiting diarrhea.  He does not endorse any SI HI auditory or visual hallucination.  He admits to marijuana use but states he had not done it recently.  He denies any other drug use.  Denies alcohol use.  History is limited due to altered mental status.  Prior to Admission medications   Medication Sig Start Date End Date Taking? Authorizing Provider  acetaminophen  (TYLENOL ) 325 MG tablet Take 2 tablets (650 mg total) by mouth every 6 (six) hours as needed for fever or headache. 03/14/20   Catheryn Hila, MD  ibuprofen  (ADVIL ) 400 MG tablet Take 1 tablet (400 mg total) by mouth every 6 (six) hours as needed for fever, headache or mild pain. 03/14/20   Catheryn Hila, MD    Allergies: Patient has no known allergies.    Review of Systems  Unable to perform ROS: Mental status change    Updated Vital Signs BP (!) 154/87 (BP Location: Right Arm)   Pulse (!) 114   Temp 98.2 F (36.8 C) (Oral)   Resp 14   SpO2 97%   Physical Exam Constitutional:      General: He is not in acute distress.    Appearance: He is well-developed.     Comments: Patient is playing on his phone appears to be in no acute discomfort.  HENT:     Head: Normocephalic and atraumatic.     Mouth/Throat:      Comments: Left upper lip is mildly swollen.  No laceration noted.  No tongue injury noted.  No malocclusion or dental injury. Eyes:     Conjunctiva/sclera: Conjunctivae normal.  Cardiovascular:     Rate and Rhythm: Tachycardia present.     Pulses: Normal pulses.     Heart sounds: Normal heart sounds.  Pulmonary:     Effort: Pulmonary effort is normal.     Breath sounds: Normal breath sounds.  Abdominal:     Palpations: Abdomen is soft.  Musculoskeletal:     Cervical back: Normal range of motion and neck supple.     Comments: Moving all 4 extremities with equal effort  Skin:    Findings: No rash.  Neurological:     Mental Status: He is alert.     GCS: GCS eye subscore is 4. GCS verbal subscore is 4. GCS motor subscore is 6.     Cranial Nerves: Cranial nerves 2-12 are intact.     Sensory: Sensation is intact.     Motor: Motor function is intact.     Comments: Alert and oriented x 1    Psychiatric:        Mood and Affect: Affect is inappropriate.        Speech: Speech is delayed.  Behavior: Behavior is withdrawn.        Thought Content: Thought content is not paranoid. Thought content does not include homicidal or suicidal ideation.        Cognition and Memory: Cognition is impaired.     (all labs ordered are listed, but only abnormal results are displayed) Labs Reviewed  COMPREHENSIVE METABOLIC PANEL WITH GFR - Abnormal; Notable for the following components:      Result Value   Glucose, Bld 108 (*)    Calcium 10.9 (*)    Total Protein 8.6 (*)    Albumin 5.3 (*)    All other components within normal limits  CBC - Abnormal; Notable for the following components:   WBC 16.5 (*)    RBC 5.87 (*)    All other components within normal limits  URINE DRUG SCREEN - Abnormal; Notable for the following components:   Tetrahydrocannabinol POSITIVE (*)    All other components within normal limits  SALICYLATE LEVEL - Abnormal; Notable for the following components:   Salicylate  Lvl <7.0 (*)    All other components within normal limits  ACETAMINOPHEN  LEVEL - Abnormal; Notable for the following components:   Acetaminophen  (Tylenol ), Serum <10 (*)    All other components within normal limits  CSF CULTURE W GRAM STAIN  GRAM STAIN  ETHANOL  VITAMIN B12  URINALYSIS, ROUTINE W REFLEX MICROSCOPIC  GLUCOSE, CSF  PROTEIN, CSF  MENINGITIS/ENCEPHALITIS PANEL (CSF)    EKG: EKG Interpretation Date/Time:  Saturday November 10 2024 11:00:39 EST Ventricular Rate:  91 PR Interval:  132 QRS Duration:  92 QT Interval:  338 QTC Calculation: 415 R Axis:   -62  Text Interpretation: Sinus rhythm with marked sinus arrhythmia Left anterior fascicular block Abnormal ECG No previous ECGs available Confirmed by Cottie Cough [phone]) on 11/10/2024 11:54:37 AM  Radiology: CT Head Wo Contrast Result Date: 11/10/2024 EXAM: CT HEAD WITHOUT CONTRAST 11/10/2024 11:25:37 AM TECHNIQUE: CT of the head was performed without the administration of intravenous contrast. Automated exposure control, iterative reconstruction, and/or weight based adjustment of the mA/kV was utilized to reduce the radiation dose to as low as reasonably achievable. COMPARISON: CT of the head dated 03/10/2020. CLINICAL HISTORY: Delirium. FINDINGS: BRAIN AND VENTRICLES: No acute hemorrhage. No evidence of acute infarct. No hydrocephalus. No extra-axial collection. No mass effect or midline shift. ORBITS: No acute abnormality. SINUSES: No acute abnormality. SOFT TISSUES AND SKULL: No acute soft tissue abnormality. No skull fracture. IMPRESSION: 1. No acute intracranial abnormality. Electronically signed by: Evalene Coho MD 11/10/2024 11:32 AM EST RP Workstation: [id number]     Procedures   Medications Ordered in the ED  cefTRIAXone  (ROCEPHIN ) 2 g in sodium chloride  0.9 % 100 mL IVPB (has no administration in time range)  midazolam PF (VERSED) injection 2 mg (2 mg Intravenous Given 11/10/24 1438)  fentaNYL  (SUBLIMAZE) injection 50 mcg (50 mcg Intravenous Given 11/10/24 1439)  lidocaine  (XYLOCAINE ) 2 % (with pres) injection 200 mg (200 mg Intradermal Given by Other 11/10/24 1514)  lidocaine -EPINEPHrine-tetracaine (LET) topical gel (3 mLs Topical Given by Other 11/10/24 1500)  fentaNYL (SUBLIMAZE) injection 50 mcg (50 mcg Intravenous Given 11/10/24 1509)  midazolam PF (VERSED) injection 2 mg (2 mg Intravenous Given 11/10/24 1509)    Clinical Course as of 11/10/24 1538  Sat Nov 10, 2024  1335 This is a 23 year old previously healthy male presenting to the ED in the company of his parents with concern for AMS.  Parents report the patient return from visiting his brother  at [employer] 4 days ago.  Since getting back from his plane trip the patient has been behaving strangely.  Patient seems to be speaking slowly, breaking off midsentence's, having a disconnect and not able to hold a conversation.  The parents say this is never happened before.  They were only able to glean if the patient had used marijuana on the trip, and they are not certain if he has used marijuana before.  The patient himself is not able to provide a consistent history as he is frequently traveling often not able to follow my questions.  He does tell me, randomly at 1 point that I did eat that gummy but cannot clarify when or how much.  On exam he is afebrile, has some mild tachycardia, has difficulty following commands.  He has some mild swelling of the left upper lip without tongue swelling or rash anywhere.  He does not demonstrate photophobia or nuchal rigidity.  He is quick to laugh, as a flat affect otherwise, and trails off mid-sentence, not able to complete his thoughts.  His labs are notable for leukocytosis, and a UDS positive for THC.  CT head is unremarkable. [MT]  1337 Differential remains broad at this point.  It is possible this is some type of psychosis induced by high dose of THC or marijuana.  He does not appear  to be actively responding to internal stimuli, but it is difficult to truly ascertain this.  I have ordered MRI of the brain with and without contrast to evaluate for potential encephalitis findings -although, again there was no fever or other viral type prodrome according to his parents.  He does not demonstrate nuchal rigidity.  Drug toxidrome would also be on the differential, his particular there is another substance that we cannot test for that he ingested while at the [place]. [MT]  1436 Neurologist by phone recommending LP.  Parents consented for LP at bedside as the patient does not demonstrate capacity for medical decision making at this time.  He'll need sedatives to ensure a safer procedure, fentanyl and versed ordered [MT]  1536 Unsuccessful LP after 2 attempts.  PA will discuss with neurologist - pending now MRI brain [MT]    Clinical Course User Index [MT] Trifan, Donnice PARAS, MD                                 Medical Decision Making Amount and/or Complexity of Data Reviewed Labs: ordered. Radiology: ordered.  Risk Prescription drug management.   BP (!) 154/87 (BP Location: Right Arm)   Pulse (!) 114   Temp 98.2 F (36.8 C) (Oral)   Resp 14   SpO2 97%   11:38 AM 23 year old male brought in accompanied by [employer] for altered mental status.  Per triage note, patient was brought here by [employer].  He does not know why he is here.  Family is on the way and they suspect that he is doing drugs.  I have a very difficult time obtaining history from patient.  He denies having any active pain such as headache chest pain trouble breathing abdominal pain nausea vomiting diarrhea.  He does not endorse any SI HI auditory or visual hallucination.  He admits to marijuana use but states he had not done it recently.  He denies any other drug use.  Denies alcohol use.  History is limited due to altered mental status.  On exam patient is playing  on his phone appears to be in no acute discomfort.  He  is very slow to answer question and having difficulty processing simple questions.  Physical exam notable for some swelling to his left upper lip but no laceration noted no tongue biting.  He is moving all 4 extremities.  He seems to be easily distracted.  He is alert and oriented x 1.  The patient's parents arrived and I was able to obtain more history.  Patient recently moved out and not living with the parents for the past month.  He went to visit his brother in [place]  and just came back home 5 days ago. Parent picked him up and because it was late he was staying at their house.  However they have noticed that he hasn't been himself since he came home.  Slow to respond to questions and appears more confuse than his baseline.  He is normally functional and alert&oriented at baseline.    -Labs ordered, independently viewed and interpreted by me.  Labs remarkable for elevated white count of 16.5, -The patient was maintained on a cardiac monitor.  I personally viewed and interpreted the cardiac monitored which showed an underlying rhythm of: sinus tachycardia -Imaging independently viewed and interpreted by me and I agree with radiologist's interpretation.  Result remarkable for head CT without acute changes -This patient presents to the ED for concern of AMS, this involves an extensive number of treatment options, and is a complaint that carries with it a high risk of complications and morbidity.  The differential diagnosis includes encephalitis, drug induced psychosis, depression, seizure, meningitis -Co morbidities that complicate the patient evaluation includes drug use.  -Treatment includes empiric treatment for encephalitis including rocephin , vanc and acyclovir -Reevaluation of the patient after these medicines showed that the patient stayed the same -PCP office notes or outside notes reviewed -Discussion with specialist neurologist Dr. Michaela who recommend LP, check B12, have pt transfer to  [facility] for admission for further work up.   -Escalation to admission/observation considered: patient and parent agree with hospital admission  3:42 PM Dr. Cottie has attempted LP without success.  Will request IR guided LP for further evaluation.  Will consult medicine for admission.    Appreciate consultation from [facility]  hospitalist Dr. Celinda who agrees to admit patient to [facility] for further care.     Final diagnoses:  Encephalopathy acute    ED Discharge Orders     None          Nivia Colon, PA-C 11/10/24 1614    Cottie Donnice PARAS, MD 11/11/24 [phone]

## 2024-11-10 NOTE — ED Notes (Signed)
 [facility] contacted and transport arranged

## 2024-11-10 NOTE — ED Notes (Signed)
 This Nurse received pt resting in bed with visitor at bedside. Pt was able to Identify himself and his DOB. Pt was able to ID visitor as Mom, this nurse asked pt what was his mom name. Pt responded Marval which is correct. Pt able to follow most commands, he has some difficulty forming sentences.

## 2024-11-11 ENCOUNTER — Inpatient Hospital Stay (HOSPITAL_COMMUNITY)

## 2024-11-11 DIAGNOSIS — R4182 Altered mental status, unspecified: Secondary | ICD-10-CM

## 2024-11-11 DIAGNOSIS — F1010 Alcohol abuse, uncomplicated: Secondary | ICD-10-CM | POA: Diagnosis not present

## 2024-11-11 DIAGNOSIS — G9341 Metabolic encephalopathy: Secondary | ICD-10-CM | POA: Diagnosis not present

## 2024-11-11 DIAGNOSIS — F12159 Cannabis abuse with psychotic disorder, unspecified: Principal | ICD-10-CM

## 2024-11-11 DIAGNOSIS — A86 Unspecified viral encephalitis: Secondary | ICD-10-CM | POA: Diagnosis not present

## 2024-11-11 DIAGNOSIS — G0481 Other encephalitis and encephalomyelitis: Secondary | ICD-10-CM | POA: Diagnosis not present

## 2024-11-11 DIAGNOSIS — F23 Brief psychotic disorder: Secondary | ICD-10-CM

## 2024-11-11 DIAGNOSIS — F1290 Cannabis use, unspecified, uncomplicated: Secondary | ICD-10-CM | POA: Diagnosis not present

## 2024-11-11 DIAGNOSIS — R569 Unspecified convulsions: Secondary | ICD-10-CM | POA: Diagnosis not present

## 2024-11-11 LAB — MENINGITIS/ENCEPHALITIS PANEL (CSF)

## 2024-11-11 LAB — CSF CELL COUNT WITH DIFFERENTIAL
RBC Count, CSF: 14 /mm3 — ABNORMAL HIGH
RBC Count, CSF: 2 /mm3 — ABNORMAL HIGH
Tube #: 1
Tube #: 4
WBC, CSF: 1 /mm3 (ref 0–5)
WBC, CSF: 3 /mm3 (ref 0–5)

## 2024-11-11 LAB — CBC
HCT: 41.1 % (ref 39.0–52.0)
Hemoglobin: 13.6 g/dL (ref 13.0–17.0)
MCH: 27.5 pg (ref 26.0–34.0)
MCHC: 33.1 g/dL (ref 30.0–36.0)
MCV: 83 fL (ref 80.0–100.0)
Platelets: 277 K/uL (ref 150–400)
RBC: 4.95 MIL/uL (ref 4.22–5.81)
RDW: 13.1 % (ref 11.5–15.5)
WBC: 8.7 K/uL (ref 4.0–10.5)
nRBC: 0 % (ref 0.0–0.2)

## 2024-11-11 LAB — COMPREHENSIVE METABOLIC PANEL WITH GFR
ALT: 16 U/L (ref 0–44)
AST: 17 U/L (ref 15–41)
Albumin: 3.7 g/dL (ref 3.5–5.0)
Alkaline Phosphatase: 56 U/L (ref 38–126)
Anion gap: 10 (ref 5–15)
BUN: 8 mg/dL (ref 6–20)
CO2: 25 mmol/L (ref 22–32)
Calcium: 9.1 mg/dL (ref 8.9–10.3)
Chloride: 102 mmol/L (ref 98–111)
Creatinine, Ser: 1.13 mg/dL (ref 0.61–1.24)
GFR, Estimated: >60 mL/min (ref >60)
Glucose, Bld: 182 mg/dL — ABNORMAL HIGH (ref 70–99)
Potassium: 4 mmol/L (ref 3.5–5.1)
Sodium: 137 mmol/L (ref 135–145)
Total Bilirubin: 0.8 mg/dL (ref 0.0–1.2)
Total Protein: 6.4 g/dL — ABNORMAL LOW (ref 6.5–8.1)

## 2024-11-11 LAB — PROTEIN AND GLUCOSE, CSF
Glucose, CSF: 73 mg/dL — ABNORMAL HIGH (ref 40–70)
Total  Protein, CSF: 17 mg/dL (ref 15–45)

## 2024-11-11 LAB — FOLATE: Folate: 5.9 ng/mL — ABNORMAL LOW (ref >5.9)

## 2024-11-11 LAB — RPR: RPR Ser Ql: NONREACTIVE

## 2024-11-11 MED ORDER — LIDOCAINE 1 % OPTIME INJ - NO CHARGE
5.0000 mL | Freq: Once | INTRAMUSCULAR | Status: AC
  Administered 2024-11-11: 3 mL via INTRADERMAL

## 2024-11-11 MED ORDER — HALOPERIDOL LACTATE 5 MG/ML IJ SOLN
2.0000 mg | Freq: Three times a day (TID) | INTRAMUSCULAR | Status: DC | PRN
Start: 2024-11-11 — End: 2024-11-14

## 2024-11-11 MED ORDER — THIAMINE HCL 100 MG/ML IJ SOLN
250.0000 mg | Freq: Every day | INTRAVENOUS | Status: DC
  Administered 2024-11-13: 250 mg via INTRAVENOUS
  Filled 2024-11-11 (×2): qty 2.5

## 2024-11-11 MED ORDER — THIAMINE HCL 100 MG/ML IJ SOLN: 100.0000 mg | Freq: Every day | INTRAMUSCULAR | Status: DC

## 2024-11-11 MED ORDER — THIAMINE HCL 100 MG/ML IJ SOLN
500.0000 mg | Freq: Three times a day (TID) | INTRAVENOUS | Status: AC
  Administered 2024-11-11 – 2024-11-12 (×6): 500 mg via INTRAVENOUS
  Filled 2024-11-11 (×2): qty 500
  Filled 2024-11-11 (×4): qty 5

## 2024-11-11 MED ORDER — HALOPERIDOL 1 MG PO TABS
2.0000 mg | ORAL_TABLET | Freq: Two times a day (BID) | ORAL | Status: DC
  Administered 2024-11-11 – 2024-11-14 (×7): 2 mg via ORAL
  Filled 2024-11-11 (×9): qty 2

## 2024-11-11 MED ORDER — LORAZEPAM 2 MG/ML IJ SOLN: 1.0000 mg | Freq: Three times a day (TID) | INTRAMUSCULAR | Status: DC | PRN

## 2024-11-11 NOTE — Consult Note (Signed)
 [facility] Psychiatric Consult Initial  Patient Name: .[name]  MRN: [id number]  DOB: 12-16-2001  Consult Order details:  Orders (From admission, onward)     Start     Ordered   11/11/24 1048  IP CONSULT TO PSYCHIATRY       Ordering Provider: Rosario Leatrice FERNS, MD  Provider:  (Not yet assigned)  Question Answer Comment  Location [facility]   Reason for Consult? Rule out somatoform disorder/Supra tentorial component to patient's presentation      11/11/24 1048             Mode of Visit: In person    Psychiatry Consult Evaluation  Service Date: November 11, 2024 LOS:  LOS: 1 day  Chief Complaint I think I have mental breakdown  Primary Psychiatric Diagnoses  Altered mental status of unknown etiology Acute psychotic disorder 2.  Cannabis abuse and Cannabis induced psychotic disorder   Assessment  Gary Rojas is a 23 y.o. male admitted: Medically on 11/10/2024 10:03 AM for altered mental status. He has no previous psychiatric diagnoses but has medical history significant for multiple head concussions while playing football until 2022. Psychiatry was consulted to Rule out somatoform disorder/Supra tentorial component to patient's presentation.    His current presentation of altered mental status, auditory hallucination, paranoia, changes in behavior, internal preoccupation, slow and circumstantial thought process, with paucity of thought content that started few days ago in the context of multiple stressors (smoking marijuana, history of head injury, recent travel and living alone) is most consistent with acute psychotic disorder. Patient does not meet criteria for inpatient psychiatric admission at this time due to ongoing medical workup, altered mental status,and lack of active suicidal or homicidal ideation, intent or plan. Patient is not currently on any outpatient psychotropic medications and has never seen a psychiatrist in  his life. On initial examination, patient was awake and alert with no psychomotor abnormalities. His speech is low volume, and described his mood as stressed. His thought process was  slow and circumstantial with paucity of thought content and paranoid delusion. Patient appears to be internally preoccupied, staring into space as if responding to internal stimuli.    Please see plan below for detailed recommendations.   Diagnoses:  Active Hospital problems: Principal Problem:   Acute metabolic encephalopathy Active Problems:   Altered mental status   Hypercalcemia   Hyperproteinemia   Cannabis abuse   Alcohol abuse    Plan   ## Psychiatric Medication Recommendations:  --Consider low dose Haldol  2 mg twice daily for psychosis with plan to up titrate as needed. Please note that patient is antipsychotic naive.  --Consider Haldol  2 mg PO/IM q8h PRN and Ativan  1 mg PO/IM q 8hr PRN for agitation.  --Please continue medical treatment and workup as the primary team.  --Patient does not require psychiatric inpatient admission at this time --Psychiatry consult service will continue to follow up.     ## Medical Decision Making Capacity: Not specifically addressed in this encounter  ## Further Work-up:  -- LP with CSF analysis. --Serum Autoimmune encephalopathy panel - Continuous EEG -Thiamine, RPR, heavy metals, serum autoimmune neuropathy panel TSH, B12, folate -- most recent EKG on 11/10/2024 had QtC of 415 -- Pertinent labwork reviewed earlier this admission includes: Glucose 182. Imaging: CT head without contrast with no acute intracranial normality.   MRI brain without contrast no acute intracranial abnormality.  No mass or abnormal enhancement.   ## Disposition:-- There are no psychiatric  contraindications to discharge at this time  ## Behavioral / Environmental: -Delirium Precautions: Delirium Interventions for Nursing and Staff: - RN to open blinds every AM. - To Bedside:  Glasses, hearing aide, and pt's own shoes. Make available to patients. when possible and encourage use. - Encourage po fluids when appropriate, keep fluids within reach. - OOB to chair with meals. - Passive ROM exercises to all extremities with AM & PM care. - RN to assess orientation to person, time and place QAM and PRN. - Recommend extended visitation hours with familiar family/friends as feasible. - Staff to minimize disturbances at night. Turn off television when pt asleep or when not in use.    ## Safety and Observation Level:  - Based on my clinical evaluation, I estimate the patient to be at low risk of self harm in the current setting. - At this time, we recommend  routine. This decision is based on my review of the chart including patient's history and current presentation, interview of the patient, mental status examination, and consideration of suicide risk including evaluating suicidal ideation, plan, intent, suicidal or self-harm behaviors, risk factors, and protective factors. This judgment is based on our ability to directly address suicide risk, implement suicide prevention strategies, and develop a safety plan while the patient is in the clinical setting. Please contact our team if there is a concern that risk level has changed.  CSSR Risk Category:C-SSRS RISK CATEGORY: No Risk  Suicide Risk Assessment: Patient has following modifiable risk factors for suicide: recklessness, lack of access to outpatient mental health resources, and triggering events, which we are addressing by prescribing medications. Patient has following non-modifiable or demographic risk factors for suicide: male gender Patient has the following protective factors against suicide: Supportive family  Thank you for this consult request. Recommendations have been communicated to the primary team.  We will follow up at this time.   Jan DELENA Donath, MD       History of Present Illness  Relevant Aspects of [facility] Course:  Admitted on 11/10/2024 for altered mental status.   Patient Report:  Patient seen face to face in his hospital room. He is awake, alert and oriented x 3 but with very slow and circumstantial thought process and paucity of thought content. It takes patient about one minute to respond to question. Patient could not remember the detail of the events leading to this admission but reports visiting his brother in [place]  recently and returned few days ago. When enquired about what happened in [place] , patient could not give detail account but states I think I had a mental breakdown, but could not elaborate on further questioning. He reports auditory hallucination but unable to elaborate, and appears paranoid (asking if he was in trouble and clinical research associate is here to arrest him). Patient also appears to be internally preoccupied, staring into space as if responding to internal stimuli. However, patient denies thought broadcasting or idea of reference. He described is mood as being stressed but denies hopelessness, helplessness, sleep problem, appetite issues, suicidal or homicidal ideation, intent or plan.   Patient states he has never seen a psychiatrist in his life and has never been prescribed any psychotropic medications.He has never seen a therapist. Patient endorses use of THC vape and drink alcohol socially. He could not remember the last time he smoke. Urine toxicology is positive for THC.    Collateral information:  Collateral information from the patient's father Gabreal, [place] E [phone]) at bedside indicates patient has been experiencing  memory gaps and problem holding conversation since he returned from [place]  about 4 days ago where he visited his brother. Father explains further that patient used to play football until 2022 and has been vaping THC lately. Also, father states patient may be stressed because he moved out of the house 3 weeks ago to live by himself.      Review  of Systems  Psychiatric/Behavioral:  Positive for hallucinations and substance abuse. Negative for depression and suicidal ideas. The patient is not nervous/anxious and does not have insomnia.      Psychiatric and Social History  Psychiatric History:  Information collected from patient and his father  Prev Dx/Sx: denies  Current Psych Provider: none Home Meds (current): none Previous Med Trials: none Therapy: denies  Prior Psych Hospitalization: denies  Prior Self Harm: denies Prior Violence: denies  Family Psych History: denies Family Hx suicide: denies  Social History:  Educational Hx: graduated high school Occupational Hx:  Legal Hx: Yes, was  Living Situation: used to live with parents but moved out 3 weeks ago.  Spiritual Hx: unsure Access to weapons/lethal means: patient denies    Substance History Alcohol: socially  Type of alcohol beer Last Drink patient does not know Number of drinks per day patient doe not know History of alcohol withdrawal seizures denies History of DT's denies Tobacco: denies Illicit drugs: Yes, Vapes THC.  Prescription drug abuse: Denies  Rehab hx: denies   Exam Findings  Physical Exam:  Vital Signs:  Temp:  [97.4 F (36.3 C)-98.9 F (37.2 C)] 98.5 F (36.9 C) (11/16 0734) Pulse Rate:  [78-107] 81 (11/16 0734) Resp:  [16-17] 17 (11/16 0734) BP: (114-147)/(62-98) 134/78 (11/16 0734) SpO2:  [98 %-100 %] 99 % (11/16 0734) Weight:  [88 kg] 88 kg (11/15 1600) Blood pressure 134/78, pulse 81, temperature 98.5 F (36.9 C), resp. rate 17, height 6' (1.829 m), weight 88 kg, SpO2 99%. Body mass index is 26.3 kg/m.  Physical Exam  Mental Status Exam: General Appearance: Casual  Orientation:  Full (Time, Place, and Person)  Memory:  Immediate;   Fair Recent;   Fair Remote;   Fair  Concentration:  Concentration: Fair and Attention Span: Fair  Recall:  Fair  Attention  Poor  Eye Contact:  Fair  Speech:  Slow  Language:  Good   Volume:  Decreased  Mood: stressed  Affect:  Constricted  Thought Process:  Descriptions of Associations: Circumstantial  Thought Content:  Hallucinations: Auditory  Suicidal Thoughts:  No  Homicidal Thoughts:  No  Judgement:  Fair  Insight:  Fair  Psychomotor Activity:  Normal  Akathisia:  No  Fund of Knowledge:  Fair      Assets:  Communication Skills  Cognition:  Impaired,  Mild  ADL's:  Impaired  AIMS (if indicated):        Other History   These have been pulled in through the EMR, reviewed, and updated if appropriate.  Family History:  The patient's family history is not on file.  Medical History: History reviewed. No pertinent past medical history.  Surgical History: History reviewed. No pertinent surgical history.   Medications:   Current Facility-Administered Medications:    acetaminophen  (TYLENOL ) tablet 650 mg, 650 mg, Oral, Q6H PRN **OR** acetaminophen  (TYLENOL ) suppository 650 mg, 650 mg, Rectal, Q6H PRN, Celinda Alm Lot, MD   acyclovir (ZOVIRAX) 860 mg in dextrose  5 % 250 mL IVPB, 10 mg/kg, Intravenous, Q8H, Nicholaus Quarry, RPH, Last Rate: 267.2 mL/hr at 11/11/24 0324, 860 mg  at 11/11/24 0324   cefTRIAXone  (ROCEPHIN ) 2 g in sodium chloride  0.9 % 100 mL IVPB, 2 g, Intravenous, Q12H, Celinda Alm Lot, MD, Last Rate: 200 mL/hr at 11/11/24 0559, 2 g at 11/11/24 0559   lactated ringers infusion, , Intravenous, Continuous, Rosario Eland I, MD, Last Rate: 125 mL/hr at 11/10/24 2346, New Bag at 11/10/24 2346   ondansetron  (ZOFRAN ) tablet 4 mg, 4 mg, Oral, Q6H PRN **OR** ondansetron  (ZOFRAN ) injection 4 mg, 4 mg, Intravenous, Q6H PRN, Celinda Alm Lot, MD   thiamine (VITAMIN B1) 500 mg in sodium chloride  0.9 % 50 mL IVPB, 500 mg, Intravenous, Q8H, Last Rate: 110 mL/hr at 11/11/24 0857, 500 mg at 11/11/24 0857 **FOLLOWED BY** [START ON 11/13/2024] thiamine (VITAMIN B1) 250 mg in sodium chloride  0.9 % 50 mL IVPB, 250 mg, Intravenous, Daily **FOLLOWED BY**  [START ON 11/19/2024] thiamine (VITAMIN B1) injection 100 mg, 100 mg, Intravenous, Daily, Khaliqdina, Salman, MD   vancomycin (VANCOCIN) IVPB 1000 mg/200 mL premix, 1,000 mg, Intravenous, Q8H, Nicholaus Quarry, RPH, Last Rate: 200 mL/hr at 11/11/24 0854, 1,000 mg at 11/11/24 9145  Allergies: No Known Allergies  Essynce Munsch A Marnette Perkins, MD

## 2024-11-11 NOTE — Progress Notes (Signed)
 PROGRESS NOTE    Gary Rojas  FMW:[id number] DOB: 2001/11/30 DOA: 11/10/2024 PCP: Cleotilde Oneil FALCON, MD  Outpatient Specialists:     Brief Narrative:  Patient is a 23 year old male with history of multiple head concussions while playing football.  Patient was brought to the emergency room due to altered mental status.  No history of trauma.  Recently moved to [place], [place]  (patient moved out of the parents house).  Patient is not very forthcoming with history.  No associated constitutional symptoms.  Specifically, no fever or chills, no nuchal rigidity.  Input from the neurology team is highly appreciated.  UDS was positive for tetrahydrocannabinol.  MRI brain and CT head without contrast did not reveal any acute abnormality.  EEG result is pending.  Patient is currently on vancomycin, Rocephin  and IV acyclovir.  CSF analysis is nonrevealing to date.  Pertinent labs on presentation revealed folate of 5.9, total protein of 8.6 (improved to 6.4, suspect dehydration) and calcium of 10.9 (improved to 9.1, with hydration).  11/11/2024: Patient seen alongside patient's further, patient's aunt and paternal grandfather.  Patient is not very forthcoming with history.  Patient does not look toxic.  Will consult the psychiatry team as well.  Assessment & Plan:   Principal Problem:   Acute metabolic encephalopathy Active Problems:   Altered mental status   Hypercalcemia   Hyperproteinemia   Cannabis abuse   Alcohol abuse   Acute metabolic encephalopathy: -Continue frequent neurochecks. - Follow-up final CSF result. - Continue IV vancomycin, Rocephin  and acyclovir for now. - Follow results of EEG. - CT head without contrast and MRI brain were nonrevealing. -Neurology input is appreciated. -Keep self adequately hydrated. - Consult psychiatric team (cannot rule out supratentorial component to patient's presentation). - Further management will depend on hospital course.    Hypercalcemia: Secondary to volume depletion. Resolved. Calcium has improved from 10.9-9.1 with hydration.    Volume depletion: - Resolved.     Hyperproteinemia: -Likely secondary to volume depletion. -Protein has improved from 8.6-6.4.   Cannabis abuse: Counseled to quit.   Alcohol abuse: As per prior documentation: Unknown amount. Monitor closely for any withdrawal symptoms. Begin CIWA protocol as needed. 11/11/2024: Consider checking GGT in the morning.  DVT prophylaxis:  Code Status: Full code Family Communication: Father by bedside Disposition Plan: Inpatient for now.   Consultants:  Neurology.  Procedures:  None.  Antimicrobials:  IV vancomycin. IV Rocephin . IV acyclovir.   Subjective: -Not particularly forthcoming with history. - Denies any constitutional symptoms. - No neck stiffness.  Objective: Vitals:   11/10/24 2046 11/11/24 0117 11/11/24 0516 11/11/24 0734  BP: (!) 145/95 130/78 119/62 134/78  Pulse: 78 82 91 81  Resp: 16 16 16 17   Temp: 98.3 F (36.8 C) 98.2 F (36.8 C) (!) 97.4 F (36.3 C) 98.5 F (36.9 C)  TempSrc:   Oral   SpO2: 100% 100% 99% 99%  Weight:      Height:        Intake/Output Summary (Last 24 hours) at 11/11/2024 0913 Last data filed at 11/11/2024 0700 Gross per 24 hour  Intake 2214.62 ml  Output --  Net 2214.62 ml   Filed Weights   11/10/24 1600  Weight: 88 kg    Examination:  General exam: Appears calm and comfortable  Respiratory system: Clear to auscultation. Respiratory effort normal. Cardiovascular system: S1 & S2 heard Gastrointestinal system: Abdomen is soft and nontender.  Central nervous system: Alert and oriented.  ?Bradyphrenic. Extremities: No leg edema.  Data Reviewed: I have personally reviewed following labs and imaging studies  CBC: Recent Labs  Lab 11/10/24 1041  WBC 16.5*  HGB 16.3  HCT 49.2  MCV 83.8  PLT 378   Basic Metabolic Panel: Recent Labs  Lab 11/10/24 1041  NA  136  K 4.6  CL 100  CO2 25  GLUCOSE 108*  BUN 9  CREATININE 1.08  CALCIUM 10.9*   GFR: Estimated Creatinine Clearance: 118.8 mL/min (by C-G formula based on SCr of 1.08 mg/dL). Liver Function Tests: Recent Labs  Lab 11/10/24 1041  AST 26  ALT 22  ALKPHOS 97  BILITOT 0.7  PROT 8.6*  ALBUMIN 5.3*   No results for input(s): LIPASE, AMYLASE in the last 168 hours. No results for input(s): AMMONIA in the last 168 hours. Coagulation Profile: No results for input(s): INR, PROTIME in the last 168 hours. Cardiac Enzymes: No results for input(s): CKTOTAL, CKMB, CKMBINDEX, TROPONINI in the last 168 hours. BNP (last 3 results) No results for input(s): PROBNP in the last 8760 hours. HbA1C: No results for input(s): HGBA1C in the last 72 hours. CBG: No results for input(s): GLUCAP in the last 168 hours. Lipid Profile: No results for input(s): CHOL, HDL, LDLCALC, TRIG, CHOLHDL, LDLDIRECT in the last 72 hours. Thyroid Function Tests: No results for input(s): TSH, T4TOTAL, FREET4, T3FREE, THYROIDAB in the last 72 hours. Anemia Panel: Recent Labs    11/10/24 1450 11/11/24 0528  VITAMINB12 515  --   FOLATE  --  5.9*   Urine analysis:    Component Value Date/Time   COLORURINE YELLOW 03/10/2020 1737   APPEARANCEUR CLEAR 03/10/2020 1737   LABSPEC 1.009 03/10/2020 1737   PHURINE 6.0 03/10/2020 1737   GLUCOSEU NEGATIVE 03/10/2020 1737   HGBUR NEGATIVE 03/10/2020 1737   BILIRUBINUR NEGATIVE 03/10/2020 1737   KETONESUR NEGATIVE 03/10/2020 1737   PROTEINUR NEGATIVE 03/10/2020 1737   NITRITE NEGATIVE 03/10/2020 1737   LEUKOCYTESUR NEGATIVE 03/10/2020 1737   Sepsis Labs: @LABRCNTIP (procalcitonin:4,lacticidven:4)  )No results found for this or any previous visit (from the past 240 hours).       Radiology Studies: MR Brain W and Wo Contrast Result Date: 11/10/2024 EXAM: MRI BRAIN WITH AND WITHOUT CONTRAST 11/10/2024 04:20:09 PM  TECHNIQUE: Multiplanar multisequence MRI of the head/brain was performed with and without the administration of intravenous contrast. COMPARISON: None available. CLINICAL HISTORY: Mental status change, unknown cause Mental status change, unknown cause FINDINGS: BRAIN AND VENTRICLES: No acute infarct. No acute intracranial hemorrhage. No mass effect or midline shift. No hydrocephalus. The sella is unremarkable. Normal flow voids. No mass or abnormal enhancement. ORBITS: No acute abnormality. SINUSES: No acute abnormality. BONES AND SOFT TISSUES: Normal bone marrow signal and enhancement. No acute soft tissue abnormality. IMPRESSION: 1. No acute intracranial abnormality. 2. No mass or abnormal enhancement. Electronically signed by: Lonni Necessary MD 11/10/2024 04:50 PM EST RP Workstation: [id number]   CT Head Wo Contrast Result Date: 11/10/2024 EXAM: CT HEAD WITHOUT CONTRAST 11/10/2024 11:25:37 AM TECHNIQUE: CT of the head was performed without the administration of intravenous contrast. Automated exposure control, iterative reconstruction, and/or weight based adjustment of the mA/kV was utilized to reduce the radiation dose to as low as reasonably achievable. COMPARISON: CT of the head dated 03/10/2020. CLINICAL HISTORY: Delirium. FINDINGS: BRAIN AND VENTRICLES: No acute hemorrhage. No evidence of acute infarct. No hydrocephalus. No extra-axial collection. No mass effect or midline shift. ORBITS: No acute abnormality. SINUSES: No acute abnormality. SOFT TISSUES AND SKULL: No acute soft tissue abnormality. No skull  fracture. IMPRESSION: 1. No acute intracranial abnormality. Electronically signed by: Evalene Coho MD 11/10/2024 11:32 AM EST RP Workstation: [id number]        Scheduled Meds:  [START ON 11/19/2024] thiamine (VITAMIN B1) injection  100 mg Intravenous Daily   Continuous Infusions:  acyclovir 860 mg (11/11/24 0324)   cefTRIAXone  (ROCEPHIN )  IV 2 g (11/11/24 0559)   lactated ringers  125 mL/hr at 11/10/24 2346   thiamine (VITAMIN B1) injection 500 mg (11/11/24 0857)   Followed by   NOREEN ON 11/13/2024] thiamine (VITAMIN B1) injection     vancomycin 1,000 mg (11/11/24 0854)     LOS: 1 day    Time spent: 55 minutes.    Leatrice Chapel, MD  [facility] Pager #: [phone] 7PM-7AM contact night coverage as above

## 2024-11-11 NOTE — Plan of Care (Signed)

## 2024-11-11 NOTE — Progress Notes (Signed)
 LTM EEG hooked up and running - no initial skin breakdown - push button tested - [facility] monitoring.

## 2024-11-11 NOTE — Procedures (Signed)
 PROCEDURE SUMMARY:  Successful fluoroscopic guided lumbar puncture. No immediate complications.  Pt tolerated well.   EBL = none  Please see full dictation in imaging section of Epic for procedure details.

## 2024-11-11 NOTE — Progress Notes (Signed)
 NEUROLOGY CONSULT FOLLOW UP NOTE   Date of service: November 11, 2024 Patient Name: Gary Rojas MRN:  [id number] DOB:  12-07-2001  Interval Hx/subjective   Dad is at the bedside.  Patient is sitting up in the bed in no apparent distress. Dad states that there is slight improvement at times, but otherwise is pretty similar to how he has been. Waiting for LP to be done under radiology.  Continuous EEG ordered  Vitals   Vitals:   11/10/24 2046 11/11/24 0117 11/11/24 0516 11/11/24 0734  BP: (!) 145/95 130/78 119/62 134/78  Pulse: 78 82 91 81  Resp: 16 16 16 17   Temp: 98.3 F (36.8 C) 98.2 F (36.8 C) (!) 97.4 F (36.3 C) 98.5 F (36.9 C)  TempSrc:   Oral   SpO2: 100% 100% 99% 99%  Weight:      Height:         Body mass index is 26.3 kg/m.  Physical Exam   Constitutional: Appears well-developed and well-nourished.  Psych: Flat Eyes: No scleral injection.  HENT: No OP obstrucion.  Head: Normocephalic.  Cardiovascular: Normal rate and regular rhythm. Respiratory: Effort normal, non-labored breathing.  GI: Soft.  No distension. There is no tenderness.  Skin: WDI.   Neurologic Examination   Mental Status -  He is awake and alert.  He is able to tell me his name, age, month and year.  He is unable to state situation.  He is very slow to respond and needs repeat questioning.  Can follow simple commands, but complex commands are difficult  Cranial Nerves II - XII - II - Visual field intact OU. III, IV, VI - Extraocular movements intact. V - Facial sensation intact bilaterally. VII - Facial movement intact bilaterally. VIII - Hearing & vestibular intact bilaterally. X - Palate elevates symmetrically. XI - Chin turning & shoulder shrug intact bilaterally. XII - Tongue protrusion intact.  Motor Strength - The patient's strength was normal in all extremities and pronator drift was absent.  Bulk was normal and fasciculations were absent.   Motor Tone - Muscle  tone was assessed at the neck and appendages and was normal. Sensory - Light touch, temperature/pinprick were assessed and were symmetrical.   Coordination - The patient had normal movements in the hands and feet with no ataxia or dysmetria.  Tremor was absent.  Gait and Station - deferred.  Medications  Current Facility-Administered Medications:    acetaminophen  (TYLENOL ) tablet 650 mg, 650 mg, Oral, Q6H PRN **OR** acetaminophen  (TYLENOL ) suppository 650 mg, 650 mg, Rectal, Q6H PRN, Celinda Alm Lot, MD   acyclovir (ZOVIRAX) 860 mg in dextrose  5 % 250 mL IVPB, 10 mg/kg, Intravenous, Q8H, Nicholaus Quarry, RPH, Last Rate: 267.2 mL/hr at 11/11/24 0324, 860 mg at 11/11/24 0324   cefTRIAXone  (ROCEPHIN ) 2 g in sodium chloride  0.9 % 100 mL IVPB, 2 g, Intravenous, Q12H, Celinda Alm Lot, MD, Last Rate: 200 mL/hr at 11/11/24 0559, 2 g at 11/11/24 0559   lactated ringers infusion, , Intravenous, Continuous, Rosario Eland I, MD, Last Rate: 125 mL/hr at 11/10/24 2346, New Bag at 11/10/24 2346   ondansetron  (ZOFRAN ) tablet 4 mg, 4 mg, Oral, Q6H PRN **OR** ondansetron  (ZOFRAN ) injection 4 mg, 4 mg, Intravenous, Q6H PRN, Celinda Alm Lot, MD   thiamine (VITAMIN B1) 500 mg in sodium chloride  0.9 % 50 mL IVPB, 500 mg, Intravenous, Q8H, Last Rate: 110 mL/hr at 11/11/24 0857, 500 mg at 11/11/24 0857 **FOLLOWED BY** [START ON 11/13/2024] thiamine (VITAMIN B1)  250 mg in sodium chloride  0.9 % 50 mL IVPB, 250 mg, Intravenous, Daily **FOLLOWED BY** [START ON 11/19/2024] thiamine (VITAMIN B1) injection 100 mg, 100 mg, Intravenous, Daily, Khaliqdina, Salman, MD   vancomycin (VANCOCIN) IVPB 1000 mg/200 mL premix, 1,000 mg, Intravenous, Q8H, Nicholaus Quarry, RPH, Last Rate: 200 mL/hr at 11/11/24 0854, 1,000 mg at 11/11/24 0854  Labs and Diagnostic Imaging   CBC:  Recent Labs  Lab 11/10/24 1041  WBC 16.5*  HGB 16.3  HCT 49.2  MCV 83.8  PLT 378    Basic Metabolic Panel:  Lab Results  Component Value Date    NA 136 11/10/2024   K 4.6 11/10/2024   CO2 25 11/10/2024   GLUCOSE 108 (H) 11/10/2024   BUN 9 11/10/2024   CREATININE 1.08 11/10/2024   CALCIUM 10.9 (H) 11/10/2024   GFRNONAA >60 11/10/2024   GFRAA NOT CALCULATED 03/13/2020   Lipid Panel: No results found for: LDLCALC HgbA1c: No results found for: HGBA1C Urine Drug Screen:     Component Value Date/Time   LABOPIA NEGATIVE 11/10/2024 1140   COCAINSCRNUR NEGATIVE 11/10/2024 1140   LABBENZ NEGATIVE 11/10/2024 1140   AMPHETMU NEGATIVE 11/10/2024 1140   THCU POSITIVE (A) 11/10/2024 1140   LABBARB NEGATIVE 11/10/2024 1140    Alcohol Level     Component Value Date/Time   ETH <15 11/10/2024 1041   INR No results found for: INR APTT No results found for: APTT AED levels: No results found for: PHENYTOIN, ZONISAMIDE, LAMOTRIGINE, LEVETIRACETA  CT Head without contrast(Personally reviewed): No acute process  MRI Brain(Personally reviewed): No acute process   rEEG 11/16 This study is within normal limits. No seizures or epileptiform discharges were seen throughout the recording.   LTM EEG:  Ordered  Labs B12 515 UDS positive for THC Assessment   Treshon Stannard is a 23 y.o. male p/w one week of progressive poor attention, bradyphrenia and poor insight. Exam otherwise is non focal. He is awake and alert othewise. Has also been sweating and warm to touch over the last week. There is no nuchal rigidity and negative kernigs and brudzinskis sign.  There is history of marijuana and EtOh use along with poor eating habits.   Differential is pretty broad. I dont think THC use and EtOH alone explains his current presentation. Suspect potential viral or autoimmune encephalitis. Would expect this to be a aubacute decline if this was thiamine of B12 deficiency but is on the differential.  Recommendations  -LP under fluoroscopy; send CSF Cell count, differential, protein and glucose, CSF gram stain and cultures,  CSF meningitis/encephalitis panel, EBV and CMV PCR on CSF, CSF and serum Autoimmune encephalopathy panel, oligoclonal bands and IgG index. - Continuous EEG-already ordered  - continue empiric meningitis coverage for now - High-dose thiamine-already ordered -Follow labs: Thiamine, RPR, heavy metals, serum autoimmune neuropathy panel -Neurology will continue to follow _____________________________________________________________________   Signed, Karna DELENA Geralds, NP [facility]  I have seen the patient reviewed the above note.  His initial CSF results look reassuring, and he is being connected to continuous EEG now.  With his initial CSF results, I have discontinued his antibacterials but would follow-up on meningitis panel prior to discontinuing antivirals.  Also agree with involving psychiatry as well, and they have started Haldol  and we will see how he responds to this.  Neurology will follow.  Aisha Seals, MD [facility]   If 7pm- 7am, please page neurology on call as listed in AMION.

## 2024-11-11 NOTE — Progress Notes (Signed)
 Routine EEG complete. Will return to perform LTM once patient comes back from LP

## 2024-11-11 NOTE — Procedures (Signed)
 Patient Name: Gary Rojas  MRN: [id number]  Epilepsy Attending: Arlin MALVA Krebs  Referring Physician/Provider: Khaliqdina, Salman, MD  Date: 11/11/2024 Duration: 22.55 mins  Patient history: 23 y.o. male p/w one week of progressive poor attention, bradyphrenia and poor insight. EEG to evaluate for seizure.  Level of alertness: Awake  AEDs during EEG study: None  Technical aspects: This EEG study was done with scalp electrodes positioned according to the 10-20 International system of electrode placement. Electrical activity was reviewed with band pass filter of 1-70Hz , sensitivity of 7 uV/mm, display speed of 53mm/sec with a 60Hz  notched filter applied as appropriate. EEG data were recorded continuously and digitally stored.  Video monitoring was available and reviewed as appropriate.  Description: The posterior dominant rhythm consists of 9-10 Hz activity of moderate voltage (25-35 uV) seen predominantly in posterior head regions, symmetric and reactive to eye opening and eye closing. Hyperventilation and photic stimulation were not performed.     IMPRESSION: This study is within normal limits. No seizures or epileptiform discharges were seen throughout the recording.  A normal interictal EEG does not exclude the diagnosis of epilepsy.   Tyreck Bell O Lexie Morini

## 2024-11-12 ENCOUNTER — Inpatient Hospital Stay (HOSPITAL_COMMUNITY)

## 2024-11-12 DIAGNOSIS — F99 Mental disorder, not otherwise specified: Secondary | ICD-10-CM

## 2024-11-12 DIAGNOSIS — G9341 Metabolic encephalopathy: Secondary | ICD-10-CM | POA: Diagnosis not present

## 2024-11-12 DIAGNOSIS — R569 Unspecified convulsions: Secondary | ICD-10-CM | POA: Diagnosis not present

## 2024-11-12 LAB — RENAL FUNCTION PANEL
Albumin: 3.6 g/dL (ref 3.5–5.0)
Anion gap: 7 (ref 5–15)
BUN: 10 mg/dL (ref 6–20)
CO2: 24 mmol/L (ref 22–32)
Calcium: 9.3 mg/dL (ref 8.9–10.3)
Chloride: 103 mmol/L (ref 98–111)
Creatinine, Ser: 1.13 mg/dL (ref 0.61–1.24)
GFR, Estimated: >60 mL/min (ref >60)
Glucose, Bld: 97 mg/dL (ref 70–99)
Phosphorus: 3.6 mg/dL (ref 2.5–4.6)
Potassium: 4 mmol/L (ref 3.5–5.1)
Sodium: 134 mmol/L — ABNORMAL LOW (ref 135–145)

## 2024-11-12 LAB — SEDIMENTATION RATE: Sed Rate: 1 mm/h (ref 0–16)

## 2024-11-12 LAB — C-REACTIVE PROTEIN: CRP: 1.1 mg/dL — ABNORMAL HIGH (ref <1.0)

## 2024-11-12 LAB — MAGNESIUM: Magnesium: 2 mg/dL (ref 1.7–2.4)

## 2024-11-12 LAB — PROCALCITONIN: Procalcitonin: <0.1 ng/mL

## 2024-11-12 NOTE — Progress Notes (Signed)
 NEUROLOGY CONSULT FOLLOW UP NOTE   Date of service: November 12, 2024 Patient Name: Gary Rojas MRN:  [id number] DOB:  07/23/2001  Interval Hx/subjective   Patient states he feels back to his usual self.  LTM read negative, including event button occurrence x1.  LP completed under fluoroscopy 11/16--unremarkable.   Vitals   Vitals:   11/11/24 1642 11/12/24 0038 11/12/24 0358 11/12/24 0747  BP: (!) 125/59 133/86 130/80 132/72  Pulse: 85 72 79 74  Resp: 17 20 20    Temp: 98.1 F (36.7 C) 97.7 F (36.5 C) 97.7 F (36.5 C) 97.7 F (36.5 C)  TempSrc:  Oral Oral Oral  SpO2: 98% 100% 100% 99%  Weight:      Height:         Body mass index is 26.3 kg/m.  Physical Exam   Constitutional: Appears well-developed and well-nourished.  Cardiovascular: Normal rate and regular rhythm.  Respiratory: Effort normal, non-labored breathing.   Neurologic Examination   Mental Status -  He is awake and alert.  He is able to tell me his name, age, month and year.  Some slow responses. Able to follow commands.    Cranial Nerves II - XII - II - Visual field intact OU. III, IV, VI - Extraocular movements intact. V - Facial sensation intact bilaterally. VII - Facial movement intact bilaterally. VIII - Hearing & vestibular intact bilaterally. X - Palate elevates symmetrically. XI - Chin turning & shoulder shrug intact bilaterally. XII - Tongue protrusion intact.   Motor Strength - The patient's strength was normal in all extremities and pronator drift was absent.    Motor Tone - Muscle tone was assessed at the neck and appendages and was normal. Sensory - Light touch, temperature/pinprick were assessed and were symmetrical.   Coordination - The patient had normal movements in the hands and feet with no ataxia or dysmetria.  Tremor was absent.   Gait and Station - deferred.  Medications  Current Facility-Administered Medications:    acetaminophen  (TYLENOL ) tablet 650 mg, 650  mg, Oral, Q6H PRN, 650 mg at 11/11/24 1703 **OR** acetaminophen  (TYLENOL ) suppository 650 mg, 650 mg, Rectal, Q6H PRN, Celinda Alm Lot, MD   haloperidol  (HALDOL ) tablet 2 mg, 2 mg, Oral, BID, Akintayo, Musa A, MD, 2 mg at 11/11/24 2107   haloperidol  lactate (HALDOL ) injection 2 mg, 2 mg, Intramuscular, Q8H PRN, Akintayo, Musa A, MD   LORazepam  (ATIVAN ) injection 1 mg, 1 mg, Intramuscular, Q8H PRN, Akintayo, Musa A, MD   ondansetron  (ZOFRAN ) tablet 4 mg, 4 mg, Oral, Q6H PRN **OR** ondansetron  (ZOFRAN ) injection 4 mg, 4 mg, Intravenous, Q6H PRN, Celinda Alm Lot, MD   thiamine (VITAMIN B1) 500 mg in sodium chloride  0.9 % 50 mL IVPB, 500 mg, Intravenous, Q8H, Last Rate: 110 mL/hr at 11/12/24 0539, 500 mg at 11/12/24 0539 **FOLLOWED BY** [START ON 11/13/2024] thiamine (VITAMIN B1) 250 mg in sodium chloride  0.9 % 50 mL IVPB, 250 mg, Intravenous, Daily **FOLLOWED BY** [START ON 11/19/2024] thiamine (VITAMIN B1) injection 100 mg, 100 mg, Intravenous, Daily, Khaliqdina, Salman, MD  Labs and Diagnostic Imaging   CBC:  Recent Labs  Lab 11/10/24 1041 11/11/24 0936  WBC 16.5* 8.7  HGB 16.3 13.6  HCT 49.2 41.1  MCV 83.8 83.0  PLT 378 277    Basic Metabolic Panel:  Lab Results  Component Value Date   NA 134 (L) 11/12/2024   K 4.0 11/12/2024   CO2 24 11/12/2024   GLUCOSE 97 11/12/2024   BUN  10 11/12/2024   CREATININE 1.13 11/12/2024   CALCIUM 9.3 11/12/2024   GFRNONAA >60 11/12/2024   GFRAA NOT CALCULATED 03/13/2020   Lipid Panel: No results found for: LDLCALC HgbA1c: No results found for: HGBA1C Urine Drug Screen:     Component Value Date/Time   LABOPIA NEGATIVE 11/10/2024 1140   COCAINSCRNUR NEGATIVE 11/10/2024 1140   LABBENZ NEGATIVE 11/10/2024 1140   AMPHETMU NEGATIVE 11/10/2024 1140   THCU POSITIVE (A) 11/10/2024 1140   LABBARB NEGATIVE 11/10/2024 1140    Alcohol Level     Component Value Date/Time   ETH <15 11/10/2024 1041   INR No results found for: INR APTT  No results found for: APTT AED levels: No results found for: PHENYTOIN, ZONISAMIDE, LAMOTRIGINE, LEVETIRACETA  CT Head without contrast(Personally reviewed): No acute process   MRI Brain(Personally reviewed): No acute process    rEEG 11/16 This study is within normal limits. No seizures or epileptiform discharges were seen throughout the recording.   LTM EEG:  Normal  Labs B12 515 UDS positive for [facility]  Assessment   [name] is a 23 y.o. male p/w one week of progressive poor attention, bradyphrenia and poor insight. Exam otherwise is non focal. He is awake and alert othewise. Has also been sweating and warm to touch over the last week. There is no nuchal rigidity and negative kernigs and brudzinskis sign.  There is history of marijuana and EtOh use along with poor eating habits.   Patient has history of concussions, football player. He was previously evaluated for seizures in 2018 at [facility] after passing out at school. Routine EEG, Overnight EEG and MRI negative. Labs have been unremarkable thus far incl initial CSF studies unremarkable.  Overall presentation most likely primary psychiatric disorder.  Autoimmune encephalitis is felt to be much less likely given his normal EEG, no history of clinical seizures, normal neurologic exam with [facility]'s exception of increased speech latency out.  Autoimmune encephalopathy panel in the CSF and serum have been sent out to [facility] and are expected to result in the next 10 to 14 days.  Given overall low suspicion for autoimmune encephalitis I do not recommend empiric treatment (steroids or other) in the meantime.  Recommendations   - Discontinue LTM EEG - Empiric CNS coverage d/c'd  D/w inpatient psych consult team. They agree acute psychotic episode most likely etiology of sx. They kindly agreed to f/u on autoimmune encephalitis panels pending when patient is discharged from hospital. They will also arrange  for close outpatient psych follow-up.  Neurology to sign off but feel free to reconsult if new neurologic concerns arise. ______________________________________________________________________   Signed, Rocky JAYSON Likes, NP [facility]   Attending Neurohospitalist Addendum Patient seen and examined with APP/Resident. Agree with the history and physical as documented above. Agree with the plan as documented, which I helped formulate. I have edited the note above to reflect my full findings and recommendations. I have independently reviewed the chart, obtained history, review of systems and examined the patient.I have personally reviewed pertinent head/neck/spine imaging (CT/MRI). Please feel free to call with any questions.  -- Elida Ross, MD [facility] [phone]  If 7pm- 7am, please page neurology on call as listed in AMION.

## 2024-11-12 NOTE — Procedures (Addendum)
 Patient Name: Gary Rojas  MRN: [id number]  Epilepsy Attending: Arlin MALVA Krebs  Referring Physician/Provider: Khaliqdina, Salman, MD  Duration: 11/11/2024 1833 to 11/12/2024 1230  Patient history: 23 y.o. male p/w one week of progressive poor attention, bradyphrenia and poor insight. EEG to evaluate for seizure.   Level of alertness: Awake, asleep  AEDs during EEG study: None  Technical aspects: This EEG study was done with scalp electrodes positioned according to the 10-20 International system of electrode placement. Electrical activity was reviewed with band pass filter of 1-70Hz , sensitivity of 7 uV/mm, display speed of 55mm/sec with a 60Hz  notched filter applied as appropriate. EEG data were recorded continuously and digitally stored.  Video monitoring was available and reviewed as appropriate.  Description: The posterior dominant rhythm consists of 9-10 Hz activity of moderate voltage (25-35 uV) seen predominantly in posterior head regions, symmetric and reactive to eye opening and eye closing. Sleep was characterized by vertex waves, sleep spindles (12 to 14 Hz), maximal frontocentral region. Hyperventilation and photic stimulation were not performed.     Event button was pressed on 10/11/2024 at 1935.  Reportedly patient's mother noticed him twitching/moving around funny. Concomitant EEG before, during and after the event did not any EEG change to suggest seizure.  IMPRESSION: This study is within normal limits. No seizures or epileptiform discharges were seen throughout the recording.  Event button was pressed on 10/11/2024 at 935.  Per patient's mother he was moving around funny/twitching without concomitant EEG change.  This was most likely not an epileptic event.  A normal interictal EEG does not exclude the diagnosis of epilepsy.   Gary Rojas

## 2024-11-12 NOTE — Consult Note (Signed)
 [facility] Psychiatric Consult Initial  Patient Name: .Gary Rojas  MRN: [id number]  DOB: [birth year]  Consult Order details:  Orders (From admission, onward)     Start     Ordered   11/11/24 1048  IP CONSULT TO PSYCHIATRY       Ordering Provider: Rosario Leatrice FERNS, MD  Provider:  (Not yet assigned)  Question Answer Comment  Location [facility]   Reason for Consult? Rule out somatoform disorder/Supra tentorial component to patient's presentation      11/11/24 1048             Mode of Visit: In person    Psychiatry Consult Evaluation  Service Date: November 12, 2024 LOS:  LOS: 2 days  Chief Complaint I think I have mental breakdown  differential Altered mental status of unknown etiology Primary psychotic disorder 2.  Cannabis induced psychotic disorder   Assessment  Gary Rojas is a 23 y.o. male admitted: Medically on 11/10/2024 10:03 AM for altered mental status. He has no previous psychiatric diagnoses but has medical history significant for multiple head concussions while playing football until 2022. Psychiatry was consulted to Rule out somatoform disorder/Supra tentorial component to patient's presentation.    His current presentation of altered mental status, auditory hallucination, paranoia, changes in behavior, internal preoccupation, slow and circumstantial thought process, with paucity of thought content that started few days ago in the context of multiple stressors (smoking marijuana, history of head injury, recent travel and living alone) is most consistent with acute psychotic disorder. Patient does not meet criteria for inpatient psychiatric admission at this time due to ongoing medical workup, altered mental status,and lack of active suicidal or homicidal ideation, intent or plan. Patient is not currently on any outpatient psychotropic medications and has never seen a psychiatrist in his life. On initial examination,  patient was awake and alert with no psychomotor abnormalities. His speech is low volume, and described his mood as stressed. His thought process was  slow and circumstantial with paucity of thought content and paranoid delusion. Patient appears to be internally preoccupied, staring into space as if responding to internal stimuli.   11/12/24 Patient appears to had some response per mother to the Haldol  last paranoia and is talking more.  Presentation is very typical for psychosis but this is probably the most consistent diagnosis from a psychiatric standpoint.  Catatonia is also on the differential but is lower suspicion at this time given improvements in his ability to respond to environment, eating drinking okay, etc, although patient does have paucity of speech, stares off, has moments of irritability. Provided education to mother around uncertainty and importance of ruling out serious medical issues while continuing empiric treatment for unclear psychiatric etiology and monitor daily. Appreciate the thorough workup from neurology.   Diagnoses:  Active Hospital problems: Principal Problem:   Acute metabolic encephalopathy Active Problems:   Altered mental status   Hypercalcemia   Hyperproteinemia   Cannabis abuse   Alcohol abuse    Plan   ## Psychiatric Medication Recommendations:  --Continue Haldol  2 mg twice daily for possible psychosis  Seems to be helping some  No subjective or objective EPS  -- Agree with high-dose thiamine -- Consider folate supplementation although just borderline low    ## Medical Decision Making Capacity: Not specifically addressed in this encounter  ## Further Work-up:  -- LP with CSF analysis pending preliminary negative --Serum Autoimmune encephalopathy panel pending - Continuous EEG negative for epilepsy - RPR  negative --Heavy metal pending -- ESR and CRP normal --TSH normal 6 months ago, repeat pending -- most recent EKG on 11/10/2024 had QtC  of 415 --UDS positive for THC only --B12 normal, folate borderline low 5.9 --B1 level pending -- Pertinent labwork reviewed earlier this admission includes: Glucose 182. Imaging: CT head without contrast with no acute intracranial normality.   MRI brain without contrast no acute intracranial abnormality.  No mass or abnormal enhancement.  MRI brain with and without contrast 11/15: Normal  ## Disposition:-- There are no psychiatric contraindications to discharge at this time  ## Behavioral / Environmental: -Delirium Precautions: Delirium Interventions for Nursing and Staff: - RN to open blinds every AM. - To Bedside: Glasses, hearing aide, and pt's own shoes. Make available to patients. when possible and encourage use. - Encourage po fluids when appropriate, keep fluids within reach. - OOB to chair with meals. - Passive ROM exercises to all extremities with AM & PM care. - RN to assess orientation to person, time and place QAM and PRN. - Recommend extended visitation hours with familiar family/friends as feasible. - Staff to minimize disturbances at night. Turn off television when pt asleep or when not in use.    ## Safety and Observation Level:  - Based on my clinical evaluation, I estimate the patient to be at low risk of self harm in the current setting. - At this time, we recommend  routine. This decision is based on my review of the chart including patient's history and current presentation, interview of the patient, mental status examination, and consideration of suicide risk including evaluating suicidal ideation, plan, intent, suicidal or self-harm behaviors, risk factors, and protective factors. This judgment is based on our ability to directly address suicide risk, implement suicide prevention strategies, and develop a safety plan while the patient is in the clinical setting. Please contact our team if there is a concern that risk level has changed.  CSSR Risk Category:C-SSRS RISK  CATEGORY: No Risk  Suicide Risk Assessment: Patient has following modifiable risk factors for suicide: recklessness, lack of access to outpatient mental health resources, and triggering events, which we are addressing by prescribing medications. Patient has following non-modifiable or demographic risk factors for suicide: male gender Patient has the following protective factors against suicide: Supportive family  Thank you for this consult request. Recommendations have been communicated to the primary team.  We will follow up at this time.   Gary Cornish, MD       History of Present Illness  Relevant Aspects of [facility] Course:  Admitted on 11/10/2024 for altered mental status.   Patient Report (and mother at bedside): Oriented person place and time but not situation.  Attention is poor and memory is poor/fair.  Patient is not able to provide any meaningful interview today aside to answer yes/no questions during which he cannot clarify further.  When asked about paranoia he wants restrictive and does not provide direct answer and sort of says yes/no.  As patient feels like he is at his normal thinking he says yes.  Per mother he is not at his baseline.  Mother reports that he is cannabis prior to admission.  Reports that she heard reports from his coworkers that he was acting more depressed than usual.  Patient denies AVH and paranoia. Pt denies extrapyramidal symptoms including dystonia (sudden spastic contractions of muscle groups), parkinsonism (bradykinesia, tremors, rigidity), and akathisia (severe restlessness).  Denies any other medication side effects.  Mother reports history that  is similar to what is already documented in the chart including no past psychiatric history, using THC recently, otherwise was functional   Collateral information:  Collateral information from the patient's father Gary, Rojas [phone]) at bedside indicates patient has been experiencing  memory gaps and problem holding conversation since he returned from [place]  about 4 days ago where he visited his brother. Father explains further that patient used to play football until 2022 and has been vaping THC lately. Also, father states patient may be stressed because he moved out of the house 3 weeks ago to live by himself.      Review of Systems  Psychiatric/Behavioral:  Positive for hallucinations and substance abuse. Negative for depression and suicidal ideas. The patient is not nervous/anxious and does not have insomnia.      Psychiatric and Social History  Psychiatric History:  Information collected from patient and his father and mother  Prev Dx/Sx: denies  Current Psych Provider: none Home Meds (current): none Previous Med Trials: none Therapy: denies  Prior Psych Hospitalization: denies  Prior Self Harm: denies Prior Violence: denies  Family Psych History: denies Family Hx suicide: denies  Social History:  Educational Hx: graduated high school Occupational Hx: Working as a Curator Hx: Unknown Living Situation: used to live with parents but moved out 3 weeks ago.  Spiritual Hx: unsure Access to weapons/lethal means: patient denies    Substance History Alcohol: socially  Type of alcohol beer Last Drink patient does not know Number of drinks per day patient doe not know History of alcohol withdrawal seizures denies History of DT's denies Tobacco: denies Illicit drugs: Yes, Vapes THC.  Prescription drug abuse: Denies  Rehab hx: denies   Exam Findings  Physical Exam:  Vital Signs:  Temp:  [97.7 F (36.5 C)-98.1 F (36.7 C)] 97.7 F (36.5 C) (11/17 0747) Pulse Rate:  [67-85] 67 (11/17 1155) Resp:  [17-20] 20 (11/17 0358) BP: (125-133)/(59-86) 133/71 (11/17 1155) SpO2:  [98 %-100 %] 99 % (11/17 1155) Blood pressure 133/71, pulse 67, temperature 97.7 F (36.5 C), temperature source Oral, resp. rate 20, height 6' (1.829 m), weight 88  kg, SpO2 99%. Body mass index is 26.3 kg/m.  Physical Exam Vitals and nursing note reviewed.  HENT:     Head: Normocephalic and atraumatic.  Pulmonary:     Effort: Pulmonary effort is normal.  Neurological:     General: No focal deficit present.     Mental Status: He is alert.     Mental Status Exam: General Appearance: Hooked up to continuous EEG, mother is present in the room, patient is likely staring often throughout the interview  Orientation:  Full (Time, Place, and Person)  Memory: Poor/fair in all memory domains  Concentration:  Concentration: Fair and Attention Span: Fair  Recall:  Fair  Attention  Poor  Eye Contact:  Fair  Speech:  Slow  Language:  Good  Volume:  Decreased  Mood: stressed  Affect: Restricted  Thought Process: Minimal so difficult to assess thought process  Thought Content: Appears to be preoccupied but not clearly responding to stimuli.  Denies AVH.  Does not describing delusions during conversation.  Poor insight into current condition  Suicidal Thoughts:  No  Homicidal Thoughts:  No  Judgement: Fair, is agreeable to treatment despite not understanding was going on  Insight: Poor, thinks he is back to his normal self  Psychomotor Activity:  Normal  Akathisia:  No  Fund of Knowledge:  Fair  Assets:  Communication Skills  Cognition:  Impaired,  Mild  ADL's:  Impaired  AIMS (if indicated):        Other History   These have been pulled in through the EMR, reviewed, and updated if appropriate.  Family History:  The patient's family history is not on file.  Medical History: History reviewed. No pertinent past medical history.  Surgical History: History reviewed. No pertinent surgical history.   Medications:   Current Facility-Administered Medications:    acetaminophen  (TYLENOL ) tablet 650 mg, 650 mg, Oral, Q6H PRN, 650 mg at 11/11/24 1703 **OR** acetaminophen  (TYLENOL ) suppository 650 mg, 650 mg, Rectal, Q6H PRN, Celinda Alm Lot, MD   haloperidol  (HALDOL ) tablet 2 mg, 2 mg, Oral, BID, Akintayo, Musa A, MD, 2 mg at 11/12/24 9078   haloperidol  lactate (HALDOL ) injection 2 mg, 2 mg, Intramuscular, Q8H PRN, Akintayo, Musa A, MD   LORazepam  (ATIVAN ) injection 1 mg, 1 mg, Intramuscular, Q8H PRN, Akintayo, Musa A, MD   ondansetron  (ZOFRAN ) tablet 4 mg, 4 mg, Oral, Q6H PRN **OR** ondansetron  (ZOFRAN ) injection 4 mg, 4 mg, Intravenous, Q6H PRN, Celinda Alm Lot, MD   thiamine (VITAMIN B1) 500 mg in sodium chloride  0.9 % 50 mL IVPB, 500 mg, Intravenous, Q8H, Last Rate: 110 mL/hr at 11/12/24 0539, 500 mg at 11/12/24 0539 **FOLLOWED BY** [START ON 11/13/2024] thiamine (VITAMIN B1) 250 mg in sodium chloride  0.9 % 50 mL IVPB, 250 mg, Intravenous, Daily **FOLLOWED BY** [START ON 11/19/2024] thiamine (VITAMIN B1) injection 100 mg, 100 mg, Intravenous, Daily, Khaliqdina, Salman, MD  Allergies: No Known Allergies  Gary Cornish, MD

## 2024-11-12 NOTE — Progress Notes (Signed)
 PROGRESS NOTE    Gary Rojas  FMW:[id number] DOB: 09-28-2001 DOA: 11/10/2024 PCP: Cleotilde Oneil FALCON, MD  Outpatient Specialists:     Brief Narrative:  Patient is a 23 year old male with history of multiple head concussions whilst playing football.  Patient was brought to the emergency room with reported altered mental status.  No history of trauma.  Recently moved to [place], [place]  (patient moved out of the parents house).  Recent travel to [place] , to visit the older brother.  Patient was not very forthcoming with history.  No associated constitutional symptoms.  Specifically, no fever or chills, no nuchal rigidity.  Input from the neurology team is highly appreciated.  UDS was positive for tetrahydrocannabinol.  MRI brain and CT head without contrast did not reveal any acute abnormality.  EEG result is pending.  Patient was initially started on vancomycin, Rocephin  and IV acyclovir for possible meningitis/encephalitis, however, CSF analysis is nonrevealing.  Antibiotics and antivirals have been discontinued.  Patient was volume depleted.  Psychiatric team was consulted due to concerns for possible supratentorial component to patient's presentation.  Patient has been started on Haldol  for possible acute psychosis.  EEG still in process.  11/12/2024: Seen alongside patient's mother.  Patient seems better today.  Patient is a bit more communicative.  Continue antipsychotics for now.  Assessment & Plan:   Principal Problem:   Acute metabolic encephalopathy Active Problems:   Altered mental status   Hypercalcemia   Hyperproteinemia   Cannabis abuse   Alcohol abuse   Acute metabolic encephalopathy: - See above documentation. -Patient seems to be improving. -CSF analysis is nonrevealing. -IV vancomycin, Rocephin  and acyclovir have been discontinued. - Follow results of EEG. - CT head without contrast and MRI brain were nonrevealing. -Neurology input is appreciated. -  Psychiatric input is appreciated.   - Patient is on Haldol  for possible acute psychotic disorder.   Hypercalcemia: Secondary to volume depletion. Resolved. Calcium has improved from 10.9-9.1 with hydration.    Volume depletion: - Resolved.     Hyperproteinemia: -Likely secondary to volume depletion. -Protein has improved from 8.6-6.4.   Cannabis abuse: Counseled to quit.   Alcohol abuse: As per prior documentation: Unknown amount. Monitor closely for any withdrawal symptoms. Begin CIWA protocol as needed. 11/11/2024: Consider checking GGT in the morning.  Acute psychotic disorder: - Continue Haldol . - Cannot rule out somatoform disorder.  DVT prophylaxis:  Code Status: Full code Family Communication: Father by bedside Disposition Plan: Inpatient for now.   Consultants:  Neurology.  Procedures:  None.  Antimicrobials:  IV vancomycin discontinued. IV Rocephin  discontinued. IV acyclovir discontinued.   Subjective: - Seen alongside patient's mother and maternal grandmother. - Slowly improving. - More communicative today. - Intermittent historian process.  Objective: Vitals:   11/12/24 0038 11/12/24 0358 11/12/24 0747 11/12/24 1155  BP: 133/86 130/80 132/72 133/71  Pulse: 72 79 74 67  Resp: 20 20    Temp: 97.7 F (36.5 C) 97.7 F (36.5 C) 97.7 F (36.5 C)   TempSrc: Oral Oral Oral   SpO2: 100% 100% 99% 99%  Weight:      Height:        Intake/Output Summary (Last 24 hours) at 11/12/2024 1156 Last data filed at 11/12/2024 0400 Gross per 24 hour  Intake 419.14 ml  Output --  Net 419.14 ml   Filed Weights   11/10/24 1600  Weight: 88 kg    Examination:  General exam: Appears calm and comfortable  Respiratory system: Clear to auscultation.  Respiratory effort normal. Cardiovascular system: S1 & S2 heard Gastrointestinal system: Abdomen is soft and nontender.  Central nervous system: Alert and oriented.  ?Bradyphrenic. Extremities: No leg  edema.  Data Reviewed: I have personally reviewed following labs and imaging studies  CBC: Recent Labs  Lab 11/10/24 1041 11/11/24 0936  WBC 16.5* 8.7  HGB 16.3 13.6  HCT 49.2 41.1  MCV 83.8 83.0  PLT 378 277   Basic Metabolic Panel: Recent Labs  Lab 11/10/24 1041 11/11/24 0936 11/12/24 0427 11/12/24 0428  NA 136 137  --  134*  K 4.6 4.0  --  4.0  CL 100 102  --  103  CO2 25 25  --  24  GLUCOSE 108* 182*  --  97  BUN 9 8  --  10  CREATININE 1.08 1.13  --  1.13  CALCIUM 10.9* 9.1  --  9.3  MG  --   --  2.0  --   PHOS  --   --   --  3.6   GFR: Estimated Creatinine Clearance: 113.5 mL/min (by C-G formula based on SCr of 1.13 mg/dL). Liver Function Tests: Recent Labs  Lab 11/10/24 1041 11/11/24 0936 11/12/24 0428  AST 26 17  --   ALT 22 16  --   ALKPHOS 97 56  --   BILITOT 0.7 0.8  --   PROT 8.6* 6.4*  --   ALBUMIN 5.3* 3.7 3.6   No results for input(s): LIPASE, AMYLASE in the last 168 hours. No results for input(s): AMMONIA in the last 168 hours. Coagulation Profile: No results for input(s): INR, PROTIME in the last 168 hours. Cardiac Enzymes: No results for input(s): CKTOTAL, CKMB, CKMBINDEX, TROPONINI in the last 168 hours. BNP (last 3 results) No results for input(s): PROBNP in the last 8760 hours. HbA1C: No results for input(s): HGBA1C in the last 72 hours. CBG: No results for input(s): GLUCAP in the last 168 hours. Lipid Profile: No results for input(s): CHOL, HDL, LDLCALC, TRIG, CHOLHDL, LDLDIRECT in the last 72 hours. Thyroid Function Tests: No results for input(s): TSH, T4TOTAL, FREET4, T3FREE, THYROIDAB in the last 72 hours. Anemia Panel: Recent Labs    11/10/24 1450 11/11/24 0528  VITAMINB12 515  --   FOLATE  --  5.9*   Urine analysis:    Component Value Date/Time   COLORURINE YELLOW 03/10/2020 1737   APPEARANCEUR CLEAR 03/10/2020 1737   LABSPEC 1.009 03/10/2020 1737   PHURINE 6.0  03/10/2020 1737   GLUCOSEU NEGATIVE 03/10/2020 1737   HGBUR NEGATIVE 03/10/2020 1737   BILIRUBINUR NEGATIVE 03/10/2020 1737   KETONESUR NEGATIVE 03/10/2020 1737   PROTEINUR NEGATIVE 03/10/2020 1737   NITRITE NEGATIVE 03/10/2020 1737   LEUKOCYTESUR NEGATIVE 03/10/2020 1737   Sepsis Labs: @LABRCNTIP (procalcitonin:4,lacticidven:4)  ) Recent Results (from the past 240 hours)  CSF culture w Gram Stain     Status: None (Preliminary result)   Collection Time: 11/11/24  1:05 PM   Specimen: CSF; Cerebrospinal Fluid  Result Value Ref Range Status   Specimen Description CSF  Final   Special Requests NONE  Final   Gram Stain NO WBC SEEN NO ORGANISMS SEEN CYTOSPIN SMEAR   Final   Culture   Final    NO GROWTH < 24 HOURS Performed at [facility], [address], [place], [place] [address]    Report Status PENDING  Incomplete  Anaerobic culture w Gram Stain     Status: None (Preliminary result)   Collection Time: 11/11/24  1:38 PM   Specimen: CSF; Cerebrospinal Fluid  Result Value Ref Range Status   Specimen Description CSF  Final   Special Requests NONE  Final   Gram Stain   Final    NO WBC SEEN NO ORGANISMS SEEN CYTOSPIN SMEAR Performed at [facility], [address], [place], [place] [address]    Culture PENDING  Incomplete   Report Status PENDING  Incomplete         Radiology Studies: Overnight EEG with video Result Date: 11/12/2024 Shelton Arlin KIDD, MD     11/12/2024 10:26 AM Patient Name: Gary Rojas MRN: [id number] Epilepsy Attending: Arlin KIDD Shelton Referring Physician/Provider: Khaliqdina, Salman, MD Duration: 11/11/2024 1833 to 11/12/2024 1015 Patient history: 23 y.o. male p/w one week of progressive poor attention, bradyphrenia and poor insight. EEG to evaluate for seizure.  Level of alertness: Awake, asleep AEDs during EEG study: None Technical aspects: This EEG study was done with scalp electrodes positioned according to the 10-20  International system of electrode placement. Electrical activity was reviewed with band pass filter of 1-70Hz , sensitivity of 7 uV/mm, display speed of 29mm/sec with a 60Hz  notched filter applied as appropriate. EEG data were recorded continuously and digitally stored.  Video monitoring was available and reviewed as appropriate. Description: The posterior dominant rhythm consists of 9-10 Hz activity of moderate voltage (25-35 uV) seen predominantly in posterior head regions, symmetric and reactive to eye opening and eye closing. Sleep was characterized by vertex waves, sleep spindles (12 to 14 Hz), maximal frontocentral region. Hyperventilation and photic stimulation were not performed.   Event button was pressed on 10/11/2024 at 1935.  Reportedly patient's mother noticed him twitching/moving around funny. Concomitant EEG before, during and after the event did not any EEG change to suggest seizure. IMPRESSION: This study is within normal limits. No seizures or epileptiform discharges were seen throughout the recording. Event button was pressed on 10/11/2024 at 935.  Per patient's mother he was moving around funny/twitching without concomitant EEG change.  This was most likely not an epileptic event. A normal interictal EEG does not exclude the diagnosis of epilepsy. Priyanka KIDD Shelton   DG FL GUIDED LUMBAR PUNCTURE Result Date: 11/11/2024 CLINICAL DATA:  23 year old male with progressive altered mental status. IR was requested for lumbar puncture. EXAM: LUMBAR PUNCTURE UNDER FLUOROSCOPY PROCEDURE: An appropriate skin entry site was determined fluoroscopically. Operator donned sterile gloves and mask. Skin site was marked, then prepped with Betadine, draped in usual sterile fashion, and infiltrated locally with 1% lidocaine . A 20 gauge spinal needle advanced into the thecal sac at L3-L4 from a left interlaminar approach. Clear colorless CSF spontaneously returned, with opening pressure of 23 cm water. 15 ml CSF  were collected and divided among 4 sterile vials for the requested laboratory studies. The needle was then removed. The patient tolerated the procedure well and there were no complications. FLUOROSCOPY: Radiation Exposure Index (as provided by the fluoroscopic device): 2.8 mGy Kerma IMPRESSION: Technically successful lumbar puncture under fluoroscopy. This exam was performed by Carlin Griffon, PA-C, and was supervised and interpreted by Dr. Marcey Moan, MD. Electronically Signed   By: Marcey Moan M.D.   On: 11/11/2024 16:16   MR Brain W and Wo Contrast Result Date: 11/10/2024 EXAM: MRI BRAIN WITH AND WITHOUT CONTRAST 11/10/2024 04:20:09 PM TECHNIQUE: Multiplanar multisequence MRI of the head/brain was performed with and without the administration of intravenous contrast. COMPARISON: None available. CLINICAL HISTORY: Mental status change, unknown cause Mental status change, unknown cause  FINDINGS: BRAIN AND VENTRICLES: No acute infarct. No acute intracranial hemorrhage. No mass effect or midline shift. No hydrocephalus. The sella is unremarkable. Normal flow voids. No mass or abnormal enhancement. ORBITS: No acute abnormality. SINUSES: No acute abnormality. BONES AND SOFT TISSUES: Normal bone marrow signal and enhancement. No acute soft tissue abnormality. IMPRESSION: 1. No acute intracranial abnormality. 2. No mass or abnormal enhancement. Electronically signed by: Lonni Necessary MD 11/10/2024 04:50 PM EST RP Workstation: [id number]        Scheduled Meds:  haloperidol   2 mg Oral BID   [START ON 11/19/2024] thiamine (VITAMIN B1) injection  100 mg Intravenous Daily   Continuous Infusions:  thiamine (VITAMIN B1) injection 500 mg (11/12/24 0539)   Followed by   NOREEN ON 11/13/2024] thiamine (VITAMIN B1) injection       LOS: 2 days    Time spent: 35 minutes.    Leatrice Chapel, MD  [facility] Pager #: [phone] 7PM-7AM contact night coverage as above

## 2024-11-12 NOTE — Progress Notes (Signed)
 LTM EEG discontinued - no skin breakdown at [facility].

## 2024-11-12 NOTE — Progress Notes (Signed)
 Mobility Specialist: Progress Note   11/12/24 1500  Mobility  Activity Ambulated independently  Level of Assistance Independent  Assistive Device None  Distance Ambulated (ft) 500 ft  Activity Response Tolerated well  Mobility Referral Yes  Mobility visit 1 Mobility  Mobility Specialist Start Time (ACUTE ONLY) 1500  Mobility Specialist Stop Time (ACUTE ONLY) 1511  Mobility Specialist Time Calculation (min) (ACUTE ONLY) 11 min    Pt received in bed, agreeable to mobility session. Aunt and grandmother present and helpful. A&Ox4. Ind throughout. No unsteadiness or LOB. Stated that he has had no issue performing any hygiene tasks or ambulating within the room - aunt and grandma agree and have no concerns. No complaints. Denies any dizziness or lightheadedness. Returned to room. Left standing at bedside with all needs met, call bell in reach.   Ileana Lute Mobility Specialist Please contact via SecureChat or Rehab office at [phone]

## 2024-11-12 NOTE — Progress Notes (Signed)
 Patient is able to go outside during his therapy session per md

## 2024-11-13 DIAGNOSIS — G9341 Metabolic encephalopathy: Secondary | ICD-10-CM | POA: Diagnosis not present

## 2024-11-13 LAB — MISC LABCORP TEST (SEND OUT)
Labcorp test code: 505535
Labcorp test code: 83935
Labcorp test code: 138289

## 2024-11-13 LAB — BASIC METABOLIC PANEL WITH GFR
Anion gap: 9 (ref 5–15)
BUN: 13 mg/dL (ref 6–20)
CO2: 24 mmol/L (ref 22–32)
Calcium: 9.3 mg/dL (ref 8.9–10.3)
Chloride: 103 mmol/L (ref 98–111)
Creatinine, Ser: 1.14 mg/dL (ref 0.61–1.24)
GFR, Estimated: >60 mL/min (ref >60)
Glucose, Bld: 93 mg/dL (ref 70–99)
Potassium: 4.2 mmol/L (ref 3.5–5.1)
Sodium: 136 mmol/L (ref 135–145)

## 2024-11-13 LAB — VDRL, CSF: VDRL Quant, CSF: NONREACTIVE

## 2024-11-13 LAB — TSH: TSH: 2.65 u[IU]/mL (ref 0.350–4.500)

## 2024-11-13 NOTE — Progress Notes (Signed)
 PROGRESS NOTE    Gary Rojas  FMW:[id number]  DOB: [birth year]  DOA: 11/10/2024 PCP: Cleotilde Oneil FALCON, MD Outpatient Specialists:   Hospital course:  Patient is a 23 year old male with history of multiple head concussions whilst playing football.  Patient was brought to the emergency room with reported altered mental status.  No history of trauma.  Recently moved to [place], [place]  (patient moved out of the parents house).  Recent travel to [place] , to visit the older brother.  Patient was not very forthcoming with history.  No associated constitutional symptoms.  Specifically, no fever or chills, no nuchal rigidity.  Input from the neurology team is highly appreciated.  UDS was positive for tetrahydrocannabinol.  MRI brain and CT head without contrast did not reveal any acute abnormality.  EEG result is pending.  Patient was initially started on vancomycin, Rocephin  and IV acyclovir for possible meningitis/encephalitis, however, CSF analysis is nonrevealing.  24-hour EEG was done and is negative for ongoing seizures.  Antibiotics and antivirals have been discontinued.  Patient was volume depleted.  Psychiatric team was consulted due to concerns for possible supratentorial component to patient's presentation.  Patient has been started on Haldol  for possible acute psychosis and is doing much better.   Subjective:  Patient had earlier been very insistent on going home AMA per RN.  However when I spoke with patient and his mother, patient stated that he is interested in inpatient psychiatric care.  Notes that I need to do something, I do not know what I need to do, I need to think about it, but I need to do something.   Objective: Vitals:   11/13/24 0057 11/13/24 0424 11/13/24 0836 11/13/24 1500  BP: 125/67 128/77 126/70 113/76  Pulse: (!) 49 61 70 70  Resp: 20 20    Temp: (!) 97.4 F (36.3 C) (!) 97.5 F (36.4 C) 98 F (36.7 C)   TempSrc: Oral Oral Oral   SpO2:  100% 100% 100% 100%  Weight:      Height:       No intake or output data in the 24 hours ending 11/13/24 1714 Filed Weights   11/10/24 1600  Weight: 88 kg     Exam:  General: Somewhat lethargic appearing young man lying in bed with his attentive mother at bedside Eyes: sclera anicteric, conjuctiva mild injection bilaterally CVS: S1-S2, regular  Respiratory: CTA   GI: NABS, soft, NT  LE: Warm and well-perfused Neuro: Slow speech, some bradykinesia, grossly nonfocal  Data Reviewed:  Basic Metabolic Panel: Recent Labs  Lab 11/10/24 1041 11/11/24 0936 11/12/24 0427 11/12/24 0428 11/13/24 0442  NA 136 137  --  134* 136  K 4.6 4.0  --  4.0 4.2  CL 100 102  --  103 103  CO2 25 25  --  24 24  GLUCOSE 108* 182*  --  97 93  BUN 9 8  --  10 13  CREATININE 1.08 1.13  --  1.13 1.14  CALCIUM 10.9* 9.1  --  9.3 9.3  MG  --   --  2.0  --   --   PHOS  --   --   --  3.6  --     CBC: Recent Labs  Lab 11/10/24 1041 11/11/24 0936  WBC 16.5* 8.7  HGB 16.3 13.6  HCT 49.2 41.1  MCV 83.8 83.0  PLT 378 277     Scheduled Meds:  haloperidol   2 mg Oral BID   [  START ON 11/19/2024] thiamine (VITAMIN B1) injection  100 mg Intravenous Daily   Continuous Infusions:  thiamine (VITAMIN B1) injection 250 mg (11/13/24 0931)     Assessment & Plan:    Altered mental status Acute metabolic encephalopathy ruled out Acute psychosis in setting of intermittent MJ and EtOH use Extensive neurowork-up is negative, all antivirals and antibacterials have been discontinued No focal findings other than speech latency Autoimmune encephalopathy panel in the CSF and serum have been sent out to [facility], psychiatry will follow-up on those Neurology has signed off Discussed with psychiatry, they will assess him to see if he meets criteria for inpatient psychiatric care  MJ and alcohol use Patient advised to stop using the substances   DVT prophylaxis: Low risk, early ambulation Code  Status: Full code Family Communication: Mother was at bedside throughout     Studies: Overnight EEG with video Result Date: 11/12/2024 Shelton Arlin KIDD, MD     11/12/2024  2:17 PM Patient Name: Gary Rojas MRN: [id number] Epilepsy Attending: Arlin KIDD Shelton Referring Physician/Provider: Khaliqdina, Salman, MD Duration: 11/11/2024 1833 to 11/12/2024 1230 Patient history: 23 y.o. male p/w one week of progressive poor attention, bradyphrenia and poor insight. EEG to evaluate for seizure.  Level of alertness: Awake, asleep AEDs during EEG study: None Technical aspects: This EEG study was done with scalp electrodes positioned according to the 10-20 International system of electrode placement. Electrical activity was reviewed with band pass filter of 1-70Hz , sensitivity of 7 uV/mm, display speed of 27mm/sec with a 60Hz  notched filter applied as appropriate. EEG data were recorded continuously and digitally stored.  Video monitoring was available and reviewed as appropriate. Description: The posterior dominant rhythm consists of 9-10 Hz activity of moderate voltage (25-35 uV) seen predominantly in posterior head regions, symmetric and reactive to eye opening and eye closing. Sleep was characterized by vertex waves, sleep spindles (12 to 14 Hz), maximal frontocentral region. Hyperventilation and photic stimulation were not performed.   Event button was pressed on 10/11/2024 at 1935.  Reportedly patient's mother noticed him twitching/moving around funny. Concomitant EEG before, during and after the event did not any EEG change to suggest seizure. IMPRESSION: This study is within normal limits. No seizures or epileptiform discharges were seen throughout the recording. Event button was pressed on 10/11/2024 at 935.  Per patient's mother he was moving around funny/twitching without concomitant EEG change.  This was most likely not an epileptic event. A normal interictal EEG does not exclude the diagnosis  of epilepsy. Priyanka KIDD Shelton    Principal Problem:   Acute metabolic encephalopathy Active Problems:   Altered mental status   Hypercalcemia   Hyperproteinemia   Cannabis abuse   Alcohol abuse     Dave Mannes Tublu Fraser Busche, [facility]  If 7PM-7AM, please contact night-coverage [email or web address]   LOS: 3 days

## 2024-11-13 NOTE — Progress Notes (Signed)
 Patient is recommended for IP psych admission per Justino Cornish, MD.   Under review with [facility] Cherylynn Ernst, RN  Patient is accepted to [facility] Medicine Unit room 324 under voluntary admission.   [facility] BMU MD: Allyn Foil  Voluntary consent can be faxed to [facility]: [phone].  To call report, [facility] BMU: [phone].  Safe Transport: [phone]. If no answer, please leave a voice message with transportation request and return callback number.   Address: [address] [place], [place] [address].

## 2024-11-13 NOTE — Plan of Care (Signed)

## 2024-11-13 NOTE — Consult Note (Signed)
 [facility] Psychiatric Consult Follow-up  Patient Name: .Gary Rojas  MRN: [id number]  DOB: 2001/03/04  Consult Order details:  Orders (From admission, onward)     Start     Ordered   11/11/24 1048  IP CONSULT TO PSYCHIATRY       Ordering Provider: Rosario Leatrice FERNS, MD  Provider:  (Not yet assigned)  Question Answer Comment  Location [facility]   Reason for Consult? Rule out somatoform disorder/Supra tentorial component to patient's presentation      11/11/24 1048             Mode of Visit: In person    Psychiatry Consult Evaluation  Service Date: November 13, 2024 LOS:  LOS: 3 days  Chief Complaint I think I have mental breakdown  Psychiatric Diagnosis  Brief psychotic disorder 2. Rule out rare conditions for encephalopathy including autoimmune encephalitis, heavy metal toxicity, etc   Assessment  Gary Rojas is a 23 y.o. male admitted: Medically on 11/10/2024 10:03 AM for altered mental status. He has no previous psychiatric diagnoses but has medical history significant for multiple head concussions while playing football until 2022. Psychiatry was consulted to Rule out somatoform disorder/Supra tentorial component to patient's presentation.    His current presentation of altered mental status, auditory hallucination, paranoia, changes in behavior, internal preoccupation, slow and circumstantial thought process, with paucity of thought content that started few days ago in the context of multiple stressors (smoking marijuana, history of head injury, recent travel and living alone) is most consistent with acute psychotic disorder. Patient does not meet criteria for inpatient psychiatric admission at this time due to ongoing medical workup, altered mental status,and lack of active suicidal or homicidal ideation, intent or plan. Patient is not currently on any outpatient psychotropic medications and has never seen a psychiatrist  in his life. On initial examination, patient was awake and alert with no psychomotor abnormalities. His speech is low volume, and described his mood as stressed. His thought process was  slow and circumstantial with paucity of thought content and paranoid delusion. Patient appears to be internally preoccupied, staring into space as if responding to internal stimuli.   11/13/24 Patient is continuing to improve and is more conversational than previous days and less preoccupied.  Given this we will continue the low-dose antipsychotic.  Could consider transitioning patient to inpatient psychiatry given medical causes have been ruled out and he is much more functional now and could be able to program at an inpatient unit while continuing the antipsychotic management.  Neurology has signed off.  This provider is following for any abnormal remaining test that are expected to come back negative but if they come back positive we will realert neurology.   Diagnoses:  Active Hospital problems: Principal Problem:   Acute metabolic encephalopathy Active Problems:   Altered mental status   Hypercalcemia   Hyperproteinemia   Cannabis abuse   Alcohol abuse    Plan   ## Psychiatric Medication Recommendations:  --Continue Haldol  2 mg twice daily for possible psychosis  Continuing to improve   No subjective or objective EPS  -- Agree with high-dose thiamine -- Consider folate supplementation although just borderline low    ## Medical Decision Making Capacity: Not specifically addressed in this encounter  ## Further Work-up:  -- LP with CSF analysis pending preliminary negative --Serum Autoimmune encephalopathy panel pending - Continuous EEG negative for epilepsy - RPR negative --Heavy metal pending -- ESR and CRP normal --TSH normal  6 months ago, repeat pending -- most recent EKG on 11/10/2024 had QtC of 415 --UDS positive for THC only --B12 normal, folate borderline low 5.9 --B1 level  pending -- Pertinent labwork reviewed earlier this admission includes: Glucose 182. Imaging: CT head without contrast with no acute intracranial normality.   MRI brain without contrast no acute intracranial abnormality.  No mass or abnormal enhancement.  MRI brain with and without contrast 11/15: Normal  ## Disposition:-- There are no psychiatric contraindications to discharge at this time. Could consider transfer to inpatient psych voluntarily if medical causes are ruled out.   ## Behavioral / Environmental: -Delirium Precautions: Delirium Interventions for Nursing and Staff: - RN to open blinds every AM. - To Bedside: Glasses, hearing aide, and pt's own shoes. Make available to patients. when possible and encourage use. - Encourage po fluids when appropriate, keep fluids within reach. - OOB to chair with meals. - Passive ROM exercises to all extremities with AM & PM care. - RN to assess orientation to person, time and place QAM and PRN. - Recommend extended visitation hours with familiar family/friends as feasible. - Staff to minimize disturbances at night. Turn off television when pt asleep or when not in use.    ## Safety and Observation Level:  - Based on my clinical evaluation, I estimate the patient to be at low risk of self harm in the current setting. - At this time, we recommend  routine. This decision is based on my review of the chart including patient's history and current presentation, interview of the patient, mental status examination, and consideration of suicide risk including evaluating suicidal ideation, plan, intent, suicidal or self-harm behaviors, risk factors, and protective factors. This judgment is based on our ability to directly address suicide risk, implement suicide prevention strategies, and develop a safety plan while the patient is in the clinical setting. Please contact our team if there is a concern that risk level has changed.  CSSR Risk Category:C-SSRS RISK  CATEGORY: No Risk  Suicide Risk Assessment: Patient has following modifiable risk factors for suicide: recklessness, lack of access to outpatient mental health resources, and triggering events, which we are addressing by prescribing medications. Patient has following non-modifiable or demographic risk factors for suicide: male gender Patient has the following protective factors against suicide: Supportive family  Thank you for this consult request. Recommendations have been communicated to the primary team.  We will follow up at this time.   Justino Cornish, MD       History of Present Illness  Relevant Aspects of [facility] Course:  Admitted on 11/10/2024 for altered mental status.   Patient Report and grandfather at bedside: Patient is asking more questions today and was asking about his diagnoses and the plan.  Grandfather was at bedside and says that he has noticed a significant treatment since Sunday but still says he is off his baseline.  Patient was oriented to person place time but not situation.  His attention and memory were intact today on formal testing.  Patient denies side effects to medication.  Patient was asking for help with the epic app today so he could read his notes.  Discussed with grandfather and patient provided them information about things that been ruled out currently and what are current primary working diagnosis is.    Collateral information:  Collateral information from the patient's father Wong, [place] [phone]) at bedside indicates patient has been experiencing memory gaps and problem holding conversation since he returned from [place]   about 4 days ago where he visited his brother. Father explains further that patient used to play football until 2022 and has been vaping THC lately. Also, father states patient may be stressed because he moved out of the house 3 weeks ago to live by himself.      Review of Systems  Psychiatric/Behavioral:   Positive for hallucinations and substance abuse. Negative for depression and suicidal ideas. The patient is not nervous/anxious and does not have insomnia.        Pt denies extrapyramidal symptoms including dystonia (sudden spastic contractions of muscle groups), parkinsonism (bradykinesia, tremors, rigidity), and akathisia (severe restlessness).      Psychiatric and Social History  Psychiatric History:  Information collected from patient and his father and mother  Prev Dx/Sx: denies  Current Psych Provider: none Home Meds (current): none Previous Med Trials: none Therapy: denies  Prior Psych Hospitalization: denies  Prior Self Harm: denies Prior Violence: denies  Family Psych History: denies Family Hx suicide: denies  Social History:  Educational Hx: graduated high school Occupational Hx: Working as a Curator Hx: Unknown Living Situation: used to live with parents but moved out 3 weeks ago.  Spiritual Hx: unsure Access to weapons/lethal means: patient denies    Substance History Alcohol: socially  Type of alcohol beer Last Drink patient does not know Number of drinks per day patient doe not know History of alcohol withdrawal seizures denies History of DT's denies Tobacco: denies Illicit drugs: Yes, Vapes THC.  Prescription drug abuse: Denies  Rehab hx: denies   Exam Findings  Physical Exam:  Vital Signs:  Temp:  [97.4 F (36.3 C)-98.2 F (36.8 C)] 98 F (36.7 C) (11/18 0836) Pulse Rate:  [49-85] 70 (11/18 0836) Resp:  [20] 20 (11/18 0424) BP: (97-136)/(61-83) 126/70 (11/18 0836) SpO2:  [97 %-100 %] 100 % (11/18 0836) Blood pressure 126/70, pulse 70, temperature 98 F (36.7 C), temperature source Oral, resp. rate 20, height 6' (1.829 m), weight 88 kg, SpO2 100%. Body mass index is 26.3 kg/m.  Physical Exam Vitals and nursing note reviewed.  HENT:     Head: Normocephalic and atraumatic.  Pulmonary:     Effort: Pulmonary effort is normal.   Neurological:     General: No focal deficit present.     Mental Status: He is alert.     Mental Status Exam: General Appearance: Hooked up to continuous EEG, mother is present in the room, patient is likely staring often throughout the interview  Orientation:  Full (Time, Place, and Person)  Memory: Grossly intact based on conversation from yesterday that patient was able to recall  Concentration:  Concentration: Fair and Attention Span: Fair  Recall: Intact per conversation  Attention intact per formal testing including months backwards and weekdays backwards  Eye Contact:  Fair  Speech:  Slow  Language:  Good  Volume:  Decreased  Mood: stressed  Affect: Restricted  Thought Process: Minimal so difficult to assess thought process  Thought Content: Appears to be preoccupied but not clearly responding to stimuli.  Denies AVH.  Does not describing delusions during conversation.  Poor insight into current condition  Suicidal Thoughts:  No  Homicidal Thoughts:  No  Judgement: Fair, is agreeable to treatment despite not understanding was going on  Insight: Poor, thinks he is back to his normal self  Psychomotor Activity:  Normal  Akathisia:  No  Fund of Knowledge:  Fair      Assets:  Manufacturing Systems Engineer  Cognition:  Impaired,  Mild  ADL's:  Impaired  AIMS (if indicated):        Other History   These have been pulled in through the EMR, reviewed, and updated if appropriate.  Family History:  The patient's family history is not on file.  Medical History: History reviewed. No pertinent past medical history.  Surgical History: History reviewed. No pertinent surgical history.   Medications:   Current Facility-Administered Medications:    acetaminophen  (TYLENOL ) tablet 650 mg, 650 mg, Oral, Q6H PRN, 650 mg at 11/13/24 0942 **OR** acetaminophen  (TYLENOL ) suppository 650 mg, 650 mg, Rectal, Q6H PRN, Celinda Alm Lot, MD   haloperidol  (HALDOL ) tablet 2 mg, 2 mg, Oral,  BID, Akintayo, Musa A, MD, 2 mg at 11/13/24 0935   haloperidol  lactate (HALDOL ) injection 2 mg, 2 mg, Intramuscular, Q8H PRN, Akintayo, Musa A, MD   LORazepam  (ATIVAN ) injection 1 mg, 1 mg, Intramuscular, Q8H PRN, Akintayo, Musa A, MD   ondansetron  (ZOFRAN ) tablet 4 mg, 4 mg, Oral, Q6H PRN **OR** ondansetron  (ZOFRAN ) injection 4 mg, 4 mg, Intravenous, Q6H PRN, Celinda Alm Lot, MD   [COMPLETED] thiamine (VITAMIN B1) 500 mg in sodium chloride  0.9 % 50 mL IVPB, 500 mg, Intravenous, Q8H, Last Rate: 110 mL/hr at 11/12/24 2124, 500 mg at 11/12/24 2124 **FOLLOWED BY** thiamine (VITAMIN B1) 250 mg in sodium chloride  0.9 % 50 mL IVPB, 250 mg, Intravenous, Daily, Last Rate: 105 mL/hr at 11/13/24 0931, 250 mg at 11/13/24 0931 **FOLLOWED BY** [START ON 11/19/2024] thiamine (VITAMIN B1) injection 100 mg, 100 mg, Intravenous, Daily, Khaliqdina, Salman, MD  Allergies: No Known Allergies  Justino Cornish, MD

## 2024-11-13 NOTE — Plan of Care (Signed)
 Notified that patient was wanting to leave hospital. After discussion with primary team and family, they were open to inpatient psychiatry on voluntary basis. Medical team said that medical causes are ruled out. Feel that inpatient psychiatry is most appropriate given best working diagnosis is primary psychotic disorder. Pt has had significant improvement in his psychosis, so he is appropriate for hall with roommate and can program and is calm, cooperative, and low acuity. He really would benefit from continued monitoring, med titrating, and good follow up in community at discharge, given first episode psychosis. Notified psych admitting coordinator.

## 2024-11-13 NOTE — Progress Notes (Signed)
 Mobility Specialist: Progress Note   11/13/24 1053  Mobility  Activity Ambulated independently  Level of Assistance Independent  Assistive Device None  Distance Ambulated (ft) 1000 ft  Activity Response Tolerated well  Mobility Referral Yes  Mobility visit 1 Mobility  Mobility Specialist Start Time (ACUTE ONLY) [id number]  Mobility Specialist Stop Time (ACUTE ONLY) 0926  Mobility Specialist Time Calculation (min) (ACUTE ONLY) 20 min    Pt received ambulating in room, agreeable to mobility session. Grandpa present and helpful. Ind throughout. A little confused initially, both MS and grandpa had to explain a few times that we were going outside to get fresh air not to leave/discharge.Walked around outside for a little. Got dizzy on the back to the unit and needed to take a seated rest break. Stated it felt like the room was spinning. Returned to unit. LPN notified of dizzy spell once back in room. Left in room with all needs met, call bell in reach.   Ileana Lute Mobility Specialist Please contact via SecureChat or Rehab office at [phone]

## 2024-11-13 NOTE — Progress Notes (Signed)
 Patient was very anxious and trying to leave today nursing staff was able to redirect patient easily. Patient explained that he was better and didn't need to be here. Md was contacted and Md came to see patient. Patient agreed to go to an inpatient psych room. Psych have made arrangement for patient to be transferred possibly tonight. Patient have signed the consent form and nursing staff have faxed the consent form back. On-coming nurse was made aware and added to the group chat concerning this matter.

## 2024-11-14 ENCOUNTER — Other Ambulatory Visit: Payer: Self-pay

## 2024-11-14 ENCOUNTER — Inpatient Hospital Stay
Admission: RE | Admit: 2024-11-14 | Discharge: 2024-11-21 | DRG: 885 | Disposition: A | Discharge status: Home / Self Care | Payer: Self-pay | Source: Intra-hospital | Attending: Psychiatry | Admitting: Psychiatry

## 2024-11-14 ENCOUNTER — Encounter: Payer: Self-pay | Admitting: Psychiatry

## 2024-11-14 DIAGNOSIS — Z566 Other physical and mental strain related to work: Secondary | ICD-10-CM | POA: Diagnosis not present

## 2024-11-14 DIAGNOSIS — F1090 Alcohol use, unspecified, uncomplicated: Secondary | ICD-10-CM | POA: Diagnosis present

## 2024-11-14 DIAGNOSIS — F1290 Cannabis use, unspecified, uncomplicated: Secondary | ICD-10-CM | POA: Diagnosis present

## 2024-11-14 DIAGNOSIS — F23 Brief psychotic disorder: Principal | ICD-10-CM | POA: Diagnosis present

## 2024-11-14 DIAGNOSIS — G9341 Metabolic encephalopathy: Secondary | ICD-10-CM | POA: Diagnosis not present

## 2024-11-14 DIAGNOSIS — F1210 Cannabis abuse, uncomplicated: Secondary | ICD-10-CM

## 2024-11-14 DIAGNOSIS — R404 Transient alteration of awareness: Secondary | ICD-10-CM | POA: Diagnosis not present

## 2024-11-14 DIAGNOSIS — F1010 Alcohol abuse, uncomplicated: Secondary | ICD-10-CM | POA: Diagnosis not present

## 2024-11-14 LAB — CSF CULTURE W GRAM STAIN
Culture: NO GROWTH
Gram Stain: NONE SEEN

## 2024-11-14 LAB — BASIC METABOLIC PANEL WITH GFR
Anion gap: 10 (ref 5–15)
BUN: 14 mg/dL (ref 6–20)
CO2: 26 mmol/L (ref 22–32)
Calcium: 9.5 mg/dL (ref 8.9–10.3)
Chloride: 101 mmol/L (ref 98–111)
Creatinine, Ser: 1.17 mg/dL (ref 0.61–1.24)
GFR, Estimated: >60 mL/min (ref >60)
Glucose, Bld: 90 mg/dL (ref 70–99)
Potassium: 4.2 mmol/L (ref 3.5–5.1)
Sodium: 137 mmol/L (ref 135–145)

## 2024-11-14 LAB — HEAVY METALS, BLOOD
Arsenic: 4 ug/L (ref 0–9)
Lead: <1 ug/dL (ref 0.0–3.4)
Mercury: 1.3 ug/L (ref 0.0–14.9)

## 2024-11-14 LAB — CYTOLOGY - NON PAP

## 2024-11-14 LAB — VITAMIN B1: Vitamin B1 (Thiamine): 109.4 nmol/L (ref 66.5–200.0)

## 2024-11-14 MED ORDER — LORAZEPAM 2 MG/ML IJ SOLN
2.0000 mg | Freq: Three times a day (TID) | INTRAMUSCULAR | Status: DC | PRN
Start: 2024-11-14 — End: 2024-11-21

## 2024-11-14 MED ORDER — HALOPERIDOL LACTATE 5 MG/ML IJ SOLN
10.0000 mg | Freq: Three times a day (TID) | INTRAMUSCULAR | Status: DC | PRN
Start: 2024-11-14 — End: 2024-11-21

## 2024-11-14 MED ORDER — HALOPERIDOL LACTATE 5 MG/ML IJ SOLN: 5.0000 mg | Freq: Three times a day (TID) | INTRAMUSCULAR | Status: DC | PRN

## 2024-11-14 MED ORDER — HYDROXYZINE HCL 25 MG PO TABS
25.0000 mg | ORAL_TABLET | Freq: Three times a day (TID) | ORAL | Status: DC | PRN
  Administered 2024-11-14: 25 mg via ORAL
  Filled 2024-11-14: qty 1

## 2024-11-14 MED ORDER — ACETAMINOPHEN 325 MG PO TABS
650.0000 mg | ORAL_TABLET | Freq: Four times a day (QID) | ORAL | Status: DC | PRN
  Administered 2024-11-14 – 2024-11-17 (×2): 650 mg via ORAL
  Filled 2024-11-14 (×4): qty 2

## 2024-11-14 MED ORDER — TRAZODONE HCL 50 MG PO TABS
50.0000 mg | ORAL_TABLET | Freq: Every evening | ORAL | Status: DC | PRN
  Administered 2024-11-14: 50 mg via ORAL
  Filled 2024-11-14 (×3): qty 1

## 2024-11-14 MED ORDER — HALOPERIDOL 1 MG PO TABS
2.0000 mg | ORAL_TABLET | Freq: Two times a day (BID) | ORAL | Status: DC
  Administered 2024-11-14 – 2024-11-15 (×2): 2 mg via ORAL
  Filled 2024-11-14 (×2): qty 2

## 2024-11-14 MED ORDER — DIPHENHYDRAMINE HCL 25 MG PO CAPS: 50.0000 mg | ORAL_CAPSULE | Freq: Three times a day (TID) | ORAL | Status: DC | PRN

## 2024-11-14 MED ORDER — ACETAMINOPHEN 325 MG PO TABS: 650.0000 mg | ORAL_TABLET | Freq: Four times a day (QID) | ORAL | Status: DC | PRN

## 2024-11-14 MED ORDER — HALOPERIDOL 5 MG PO TABS: 5.0000 mg | ORAL_TABLET | Freq: Three times a day (TID) | ORAL | Status: DC | PRN

## 2024-11-14 MED ORDER — DIPHENHYDRAMINE HCL 50 MG/ML IJ SOLN
50.0000 mg | Freq: Three times a day (TID) | INTRAMUSCULAR | Status: DC | PRN
Start: 2024-11-14 — End: 2024-11-21

## 2024-11-14 MED ORDER — THIAMINE HCL 100 MG PO TABS: 100.0000 mg | ORAL_TABLET | Freq: Every day | ORAL | Status: DC

## 2024-11-14 MED ORDER — MAGNESIUM HYDROXIDE 400 MG/5ML PO SUSP
30.0000 mL | Freq: Every day | ORAL | Status: DC | PRN
Start: 2024-11-14 — End: 2024-11-21

## 2024-11-14 MED ORDER — ALUM & MAG HYDROXIDE-SIMETH 200-200-20 MG/5ML PO SUSP: 30.0000 mL | ORAL | Status: DC | PRN

## 2024-11-14 NOTE — Plan of Care (Signed)
   Problem: Education: Goal: Emotional status will improve Outcome: Progressing

## 2024-11-14 NOTE — Tx Team (Signed)
 Initial Treatment Plan 11/14/2024 2:47 PM [name] FMW:[id number]    PATIENT STRESSORS: Substance abuse     PATIENT STRENGTHS: Active sense of humor    PATIENT IDENTIFIED PROBLEMS:   Brief psychosis                    DISCHARGE CRITERIA:  Ability to meet basic life and health needs  PRELIMINARY DISCHARGE PLAN: Return to previous living arrangement  PATIENT/FAMILY INVOLVEMENT: This treatment plan has been presented to and reviewed with the patient, Gary Rojas. The patient and family have been given the opportunity to ask questions and make suggestions.  Eleanor KATHEE Flemings, RN 11/14/2024, 2:47 PM

## 2024-11-14 NOTE — TOC Transition Note (Signed)
 Transition of Care [facility]) - Discharge Note   Patient Details  Name: Gary Rojas MRN: [id number] Date of Birth: [birth year]  Transition of [facility]) CM/SW Contact:  Sherline Clack, LCSWA Phone Number: 11/14/2024, 12:35 PM   Clinical Narrative:     Patient will DC to: [facility] inpatient psych Anticipated DC date: 11/14/24  Family notified: Debbie/mother Transport by: [facility]   Per MD patient ready for DC to [facility]. RN to call report prior to discharge [phone], rm [phone]). RN, patient, patient's family, and facility notified of DC. Discharge Summary and FL2 sent to facility. DC packet on chart. Ambulance transport requested for patient.   CSW will sign off for now as social work intervention is no longer needed. Please consult us  again if new needs arise.    Final next level of care: Psychiatric Hospital Barriers to Discharge: Barriers Resolved   Patient Goals and CMS Choice            Discharge Placement                  Name of family member notified: Debbie/mother Patient and family notified of of transfer: 11/14/24  Discharge Plan and Services Additional resources added to the After Visit Summary for                                       Social Drivers of Health (SDOH) Interventions SDOH Screenings   Food Insecurity: Unknown (11/10/2024)  Housing: Low Risk  (04/30/2024)   Received from [facility]  Transportation Needs: No Transportation Needs (04/30/2024)   Received from [facility]  Utilities: Not At Risk (04/30/2024)   Received from [facility]  Financial Resource Strain: Low Risk  (04/30/2024)   Received from [facility]  Tobacco Use: Low Risk  (11/10/2024)     Readmission Risk Interventions     No data to display

## 2024-11-14 NOTE — Discharge Summary (Addendum)
 Physician Discharge Summary   Patient: Gary Rojas MRN: [id number] DOB: 07/17/2001  Admit date:     11/10/2024  Discharge date: 11/14/24  Discharge Physician: Sabas GORMAN Brod   PCP: Cleotilde Oneil FALCON, MD   Recommendations at discharge:   Patient to discharge to [facility]  Discharge Diagnoses: Principal Problem:   Acute metabolic encephalopathy Active Problems:   Altered mental status   Hypercalcemia   Hyperproteinemia   Cannabis abuse   Alcohol abuse  Resolved Problems:   * No resolved hospital problems. *  Hospital Course:  23 year old male with history of multiple head concussions whilst playing football.  Patient was brought to the emergency room with reported altered mental status.  No history of trauma.  Recently moved to [place], [place]  (patient moved out of the parents house).  Recent travel to [place] , to visit the older brother.  Patient was not very forthcoming with history.  No associated constitutional symptoms.  Specifically, no fever or chills, no nuchal rigidity.  Input from the neurology team is highly appreciated.  UDS was positive for tetrahydrocannabinol.  MRI brain and CT head without contrast did not reveal any acute abnormality.  EEG result is pending.  Patient was initially started on vancomycin , Rocephin  and IV acyclovir  for possible meningitis/encephalitis, however, CSF analysis is nonrevealing.  24-hour EEG was done and is negative for ongoing seizures.  Antibiotics and antivirals have been discontinued.  Patient was volume depleted.  Psychiatric team was consulted due to concerns for possible supratentorial component to patient's presentation.  Patient has been started on Haldol  for possible acute psychosis and is doing much better.    Assessment and Plan:  Altered mental status Acute metabolic encephalopathy ruled out Acute psychosis in setting of intermittent MJ and EtOH use Extensive neurowork-up is negative, all antivirals and antibacterials  have been discontinued No focal findings other than speech latency Autoimmune encephalopathy panel in the CSF and serum have been sent out to [facility], psychiatry will follow-up on those Neurology has signed off Discussed with psychiatry, t patient to be transferred to inpatient psych facility for further management and observation    Marijuana and alcohol use Patient advised to stop using the substances - Continue thiamine  100 mg p.o. daily  Hypercalcemia - Secondary to dehydration, resolved with IV fluids        Consultants: Neurology, psychiatry Procedures performed: LTM EEG Disposition: Inpatient psych facility Diet recommendation: Regular Discharge Diet Orders (From admission, onward)     Start     Ordered   11/14/24 0000  Diet - low sodium heart healthy        11/14/24 1001           Regular diet DISCHARGE MEDICATION: Allergies as of 11/14/2024   No Known Allergies      Medication List     TAKE these medications    acetaminophen  325 MG tablet Commonly known as: TYLENOL  Take 2 tablets (650 mg total) by mouth every 6 (six) hours as needed for mild pain (pain score 1-3) or fever (or Fever >/= 101).   thiamine  100 MG tablet Commonly known as: VITAMIN B1 Take 1 tablet (100 mg total) by mouth daily.        Follow-up Information     [facility]. Schedule an appointment as soon as possible for a visit today.   Specialty: Urgent Care Contact information: [address] [place]  [phone]  Discharge Exam: Filed Weights   11/10/24 1600  Weight: 88 kg   General-appears in no acute distress Heart-S1-S2, regular, no murmur auscultated Lungs-clear to auscultation bilaterally, no wheezing or crackles auscultated Abdomen-soft, nontender, no organomegaly Extremities-no edema in the lower extremities Neuro-alert, oriented x3, no focal deficit noted  Condition at discharge:  good  The results of significant diagnostics from this hospitalization (including imaging, microbiology, ancillary and laboratory) are listed below for reference.   Imaging Studies: Overnight EEG with video Result Date: 11/12/2024 Shelton Arlin KIDD, MD     11/12/2024  2:17 PM Patient Name: Madhav Mohon MRN: [id number] Epilepsy Attending: Arlin KIDD Shelton Referring Physician/Provider: Khaliqdina, Salman, MD Duration: 11/11/2024 1833 to 11/12/2024 1230 Patient history: 23 y.o. male p/w one week of progressive poor attention, bradyphrenia and poor insight. EEG to evaluate for seizure.  Level of alertness: Awake, asleep AEDs during EEG study: None Technical aspects: This EEG study was done with scalp electrodes positioned according to the 10-20 International system of electrode placement. Electrical activity was reviewed with band pass filter of 1-70Hz , sensitivity of 7 uV/mm, display speed of 88mm/sec with a 60Hz  notched filter applied as appropriate. EEG data were recorded continuously and digitally stored.  Video monitoring was available and reviewed as appropriate. Description: The posterior dominant rhythm consists of 9-10 Hz activity of moderate voltage (25-35 uV) seen predominantly in posterior head regions, symmetric and reactive to eye opening and eye closing. Sleep was characterized by vertex waves, sleep spindles (12 to 14 Hz), maximal frontocentral region. Hyperventilation and photic stimulation were not performed.   Event button was pressed on 10/11/2024 at 1935.  Reportedly patient's mother noticed him twitching/moving around funny. Concomitant EEG before, during and after the event did not any EEG change to suggest seizure. IMPRESSION: This study is within normal limits. No seizures or epileptiform discharges were seen throughout the recording. Event button was pressed on 10/11/2024 at 935.  Per patient's mother he was moving around funny/twitching without concomitant EEG change.  This  was most likely not an epileptic event. A normal interictal EEG does not exclude the diagnosis of epilepsy. Priyanka KIDD Shelton   DG FL GUIDED LUMBAR PUNCTURE Result Date: 11/11/2024 CLINICAL DATA:  23 year old male with progressive altered mental status. IR was requested for lumbar puncture. EXAM: LUMBAR PUNCTURE UNDER FLUOROSCOPY PROCEDURE: An appropriate skin entry site was determined fluoroscopically. Operator donned sterile gloves and mask. Skin site was marked, then prepped with Betadine, draped in usual sterile fashion, and infiltrated locally with 1% lidocaine . A 20 gauge spinal needle advanced into the thecal sac at L3-L4 from a left interlaminar approach. Clear colorless CSF spontaneously returned, with opening pressure of 23 cm water. 15 ml CSF were collected and divided among 4 sterile vials for the requested laboratory studies. The needle was then removed. The patient tolerated the procedure well and there were no complications. FLUOROSCOPY: Radiation Exposure Index (as provided by the fluoroscopic device): 2.8 mGy Kerma IMPRESSION: Technically successful lumbar puncture under fluoroscopy. This exam was performed by Carlin Griffon, PA-C, and was supervised and interpreted by Dr. Marcey Moan, MD. Electronically Signed   By: Marcey Moan M.D.   On: 11/11/2024 16:16   MR Brain W and Wo Contrast Result Date: 11/10/2024 EXAM: MRI BRAIN WITH AND WITHOUT CONTRAST 11/10/2024 04:20:09 PM TECHNIQUE: Multiplanar multisequence MRI of the head/brain was performed with and without the administration of intravenous contrast. COMPARISON: None available. CLINICAL HISTORY: Mental status change, unknown cause Mental status change, unknown cause FINDINGS: BRAIN AND  VENTRICLES: No acute infarct. No acute intracranial hemorrhage. No mass effect or midline shift. No hydrocephalus. The sella is unremarkable. Normal flow voids. No mass or abnormal enhancement. ORBITS: No acute abnormality. SINUSES: No acute  abnormality. BONES AND SOFT TISSUES: Normal bone marrow signal and enhancement. No acute soft tissue abnormality. IMPRESSION: 1. No acute intracranial abnormality. 2. No mass or abnormal enhancement. Electronically signed by: Lonni Necessary MD 11/10/2024 04:50 PM EST RP Workstation: [id number]   CT Head Wo Contrast Result Date: 11/10/2024 EXAM: CT HEAD WITHOUT CONTRAST 11/10/2024 11:25:37 AM TECHNIQUE: CT of the head was performed without the administration of intravenous contrast. Automated exposure control, iterative reconstruction, and/or weight based adjustment of the mA/kV was utilized to reduce the radiation dose to as low as reasonably achievable. COMPARISON: CT of the head dated 03/10/2020. CLINICAL HISTORY: Delirium. FINDINGS: BRAIN AND VENTRICLES: No acute hemorrhage. No evidence of acute infarct. No hydrocephalus. No extra-axial collection. No mass effect or midline shift. ORBITS: No acute abnormality. SINUSES: No acute abnormality. SOFT TISSUES AND SKULL: No acute soft tissue abnormality. No skull fracture. IMPRESSION: 1. No acute intracranial abnormality. Electronically signed by: Evalene Coho MD 11/10/2024 11:32 AM EST RP Workstation: [id number]    Microbiology: Results for orders placed or performed during the hospital encounter of 11/10/24  CSF culture w Gram Stain     Status: None   Collection Time: 11/11/24  1:05 PM   Specimen: CSF; Cerebrospinal Fluid  Result Value Ref Range Status   Specimen Description CSF  Final   Special Requests NONE  Final   Gram Stain NO WBC SEEN NO ORGANISMS SEEN CYTOSPIN SMEAR   Final   Culture   Final    NO GROWTH 3 DAYS Performed at [facility], [address] [place], [place] [address]    Report Status 11/14/2024 FINAL  Final  Anaerobic culture w Gram Stain     Status: None (Preliminary result)   Collection Time: 11/11/24  1:38 PM   Specimen: CSF; Cerebrospinal Fluid  Result Value Ref Range Status   Specimen Description CSF   Final   Special Requests NONE  Final   Gram Stain   Final    NO WBC SEEN NO ORGANISMS SEEN CYTOSPIN SMEAR Performed at [facility], [address], [place], [place] [address]    Culture   Final    NO ANAEROBES ISOLATED; CULTURE IN PROGRESS FOR 5 DAYS   Report Status PENDING  Incomplete  Fungus Culture Without Stain     Status: None (Preliminary result)   Collection Time: 11/11/24  1:39 PM   Specimen: CSF; Cerebrospinal Fluid  Result Value Ref Range Status   Specimen Description CSF  Final   Special Requests NONE  Final   Culture   Final    NO GROWTH 2 DAYS Performed at [facility], [address] [place], [place] [address]    Report Status PENDING  Incomplete    Labs: CBC: Recent Labs  Lab 11/10/24 1041 11/11/24 0936  WBC 16.5* 8.7  HGB 16.3 13.6  HCT 49.2 41.1  MCV 83.8 83.0  PLT 378 277   Basic Metabolic Panel: Recent Labs  Lab 11/10/24 1041 11/11/24 0936 11/12/24 0427 11/12/24 0428 11/13/24 0442 11/14/24 0120  NA 136 137  --  134* 136 137  K 4.6 4.0  --  4.0 4.2 4.2  CL 100 102  --  103 103 101  CO2 25 25  --  24 24 26   GLUCOSE 108*  182*  --  97 93 90  BUN 9 8  --  10 13 14   CREATININE 1.08 1.13  --  1.13 1.14 1.17  CALCIUM 10.9* 9.1  --  9.3 9.3 9.5  MG  --   --  2.0  --   --   --   PHOS  --   --   --  3.6  --   --    Liver Function Tests: Recent Labs  Lab 11/10/24 1041 11/11/24 0936 11/12/24 0428  AST 26 17  --   ALT 22 16  --   ALKPHOS 97 56  --   BILITOT 0.7 0.8  --   PROT 8.6* 6.4*  --   ALBUMIN 5.3* 3.7 3.6   CBG: No results for input(s): GLUCAP in the last 168 hours.  Discharge time spent: greater than 30 minutes.  Signed: Sabas GORMAN Brod, MD [facility] 11/14/2024

## 2024-11-14 NOTE — Group Note (Signed)
 Date:  11/14/2024 Time:  5:06 PM  Group Topic/Focus:  Wellness Toolbox:   The focus of this group is to discuss various aspects of wellness, balancing those aspects and exploring ways to increase the ability to experience wellness.  Patients will create a wellness toolbox for use upon discharge.    Participation Level:  Did Not Attend   Deitra Clap [facility] 11/14/2024, 5:06 PM

## 2024-11-14 NOTE — Group Note (Signed)
 Date:  11/14/2024 Time:  8:46 PM  Group Topic/Focus:  Wrap-Up Group:   The focus of this group is to help patients review their daily goal of treatment and discuss progress on daily workbooks.    Participation Level:  Active  Participation Quality:  Appropriate and Attentive  Affect:  Appropriate  Cognitive:  Alert and Appropriate  Insight: Appropriate and Good  Engagement in Group:  Engaged  Modes of Intervention:  Orientation  Additional Comments:     Gary Rojas 11/14/2024, 8:46 PM

## 2024-11-14 NOTE — Consult Note (Signed)
 [facility] Psychiatric Consult Follow-up  Patient Name: .Gary Rojas  MRN: [id number]  DOB: 2001-05-01  Consult Order details:  Orders (From admission, onward)     Start     Ordered   11/11/24 1048  IP CONSULT TO PSYCHIATRY       Ordering Provider: Rosario Leatrice FERNS, MD  Provider:  (Not yet assigned)  Question Answer Comment  Location [facility]   Reason for Consult? Rule out somatoform disorder/Supra tentorial component to patient's presentation      11/11/24 1048             Mode of Visit: In person    Psychiatry Consult Evaluation  Service Date: November 14, 2024 LOS:  LOS: 4 days  Chief Complaint I think I have mental breakdown  Psychiatric Diagnosis  Brief psychotic disorder 2. Rule out rare conditions for encephalopathy including autoimmune encephalitis, heavy metal toxicity, etc   Assessment  Gary Rojas is a 23 y.o. male admitted: Medically on 11/10/2024 10:03 AM for altered mental status. He has no previous psychiatric diagnoses but has medical history significant for multiple head concussions while playing football until 2022. Psychiatry was consulted to Rule out somatoform disorder/Supra tentorial component to patient's presentation.    His current presentation of altered mental status, auditory hallucination, paranoia, changes in behavior, internal preoccupation, slow and circumstantial thought process, with paucity of thought content that started few days ago in the context of multiple stressors (smoking marijuana, history of head injury, recent travel and living alone) is most consistent with acute psychotic disorder. Patient does not meet criteria for inpatient psychiatric admission at this time due to ongoing medical workup, altered mental status,and lack of active suicidal or homicidal ideation, intent or plan. Patient is not currently on any outpatient psychotropic medications and has never seen a psychiatrist  in his life. On initial examination, patient was awake and alert with no psychomotor abnormalities. His speech is low volume, and described his mood as stressed. His thought process was  slow and circumstantial with paucity of thought content and paranoid delusion. Patient appears to be internally preoccupied, staring into space as if responding to internal stimuli.   11/14/24 Presentation seems most consistent with prior psychotic sorter for which we will transfer to inpatient psych.  Patient has had some response to antipsychotic so far but is having some continued disorganization.  Haldol  was originally a empiric treatment when it was suspected psychosis but not clear if there was no cause so could consider now transitioning to another more tolerable antipsychotic for long-term management versus continuing haldol .  Long-acting ductules have good evidence and first episode psychosis so would be something to consider.  Patient is not having any side effects to the medication.  Patient and mother's questions were answered. This provider will follow rare neuro send out labs as they result and will contact neuro service should they come back positive but very unlikely they will.   Diagnoses:  Active Hospital problems: Principal Problem:   Acute metabolic encephalopathy Active Problems:   Altered mental status   Hypercalcemia   Hyperproteinemia   Cannabis abuse   Alcohol abuse    Plan   ## Psychiatric Medication Recommendations:  --Continue Haldol  2 mg twice daily for possible psychosis  Consider continuing versus transition to more tolerable option given increased liklihood of first episode psychosis. Consider LAI.    ## Medical Decision Making Capacity: Not specifically addressed in this encounter  ## Further Work-up:  -- LP with  CSF analysis pending preliminary negative --Serum Autoimmune encephalopathy panel pending - Continuous EEG negative for epilepsy - RPR negative --Heavy  metal pending -- ESR and CRP normal --TSH normal 6 months ago, repeat pending -- most recent EKG on 11/10/2024 had QtC of 415 --UDS positive for THC only --B12 normal, folate borderline low 5.9 --B1 level pending -- Pertinent labwork reviewed earlier this admission includes: Glucose 182. Imaging: CT head without contrast with no acute intracranial normality.   MRI brain without contrast no acute intracranial abnormality.  No mass or abnormal enhancement.  MRI brain with and without contrast 11/15: Normal  ## Disposition:-- transfer to [facility] inpatient psych voluntary.   ## Behavioral / Environmental: -Delirium Precautions: Delirium Interventions for Nursing and Staff: - RN to open blinds every AM. - To Bedside: Glasses, hearing aide, and pt's own shoes. Make available to patients. when possible and encourage use. - Encourage po fluids when appropriate, keep fluids within reach. - OOB to chair with meals. - Passive ROM exercises to all extremities with AM & PM care. - RN to assess orientation to person, time and place QAM and PRN. - Recommend extended visitation hours with familiar family/friends as feasible. - Staff to minimize disturbances at night. Turn off television when pt asleep or when not in use.    ## Safety and Observation Level:  - Based on my clinical evaluation, I estimate the patient to be at low risk of self harm in the current setting. - At this time, we recommend  routine. This decision is based on my review of the chart including patient's history and current presentation, interview of the patient, mental status examination, and consideration of suicide risk including evaluating suicidal ideation, plan, intent, suicidal or self-harm behaviors, risk factors, and protective factors. This judgment is based on our ability to directly address suicide risk, implement suicide prevention strategies, and develop a safety plan while the patient is in the clinical setting. Please contact our  team if there is a concern that risk level has changed.  CSSR Risk Category:C-SSRS RISK CATEGORY: No Risk  Suicide Risk Assessment: Patient has following modifiable risk factors for suicide: recklessness, lack of access to outpatient mental health resources, and triggering events, which we are addressing by prescribing medications. Patient has following non-modifiable or demographic risk factors for suicide: male gender Patient has the following protective factors against suicide: Supportive family  Thank you for this consult request. Recommendations have been communicated to the primary team.  We will sign off at this time.   Gary Cornish, MD       History of Present Illness  Relevant Aspects of [place] [facility] Course:  Admitted on 11/10/2024 for altered mental status.   Patient Report and grandfather at bedside: Patient is asking more questions today and was asking about his diagnoses and the plan.  Grandfather was at bedside and says that he has noticed a significant treatment since Sunday but still says he is off his baseline.  Patient was oriented to person place time but not situation.  His attention and memory were intact today on formal testing.  Patient denies side effects to medication.  Patient was asking for help with the epic app today so he could read his notes.  Discussed with grandfather and patient provided them information about things that been ruled out currently and what are current primary working diagnosis is.    Collateral information:  Collateral information from the patient's father Gary Rojas, Gary Rojas [phone]) at bedside indicates patient has been  experiencing memory gaps and problem holding conversation since he returned from [place]  about 4 days ago where he visited his brother. Father explains further that patient used to play football until 2022 and has been vaping THC lately. Also, father states patient may be stressed because he moved out of the house 3  weeks ago to live by himself.      Review of Systems  Psychiatric/Behavioral:  Positive for hallucinations and substance abuse. Negative for depression and suicidal ideas. The patient is not nervous/anxious and does not have insomnia.        Pt denies extrapyramidal symptoms including dystonia (sudden spastic contractions of muscle groups), parkinsonism (bradykinesia, tremors, rigidity), and akathisia (severe restlessness).      Psychiatric and Social History  Psychiatric History:  Information collected from patient and his father and mother  Prev Dx/Sx: denies  Current Psych Provider: none Home Meds (current): none Previous Med Trials: none Therapy: denies  Prior Psych Hospitalization: denies  Prior Self Harm: denies Prior Violence: denies  Family Psych History: denies Family Hx suicide: denies  Social History:  Educational Hx: graduated high school Occupational Hx: Working as a Curator Hx: Unknown Living Situation: used to live with parents but moved out 3 weeks ago.  Spiritual Hx: unsure Access to weapons/lethal means: patient denies    Substance History Alcohol: socially  Type of alcohol beer Last Drink patient does not know Number of drinks per day patient doe not know History of alcohol withdrawal seizures denies History of DT's denies Tobacco: denies Illicit drugs: Yes, Vapes THC.  Prescription drug abuse: Denies  Rehab hx: denies   Exam Findings  Physical Exam:  Vital Signs:  Temp:  [97.6 F (36.4 C)-98.3 F (36.8 C)] 98.2 F (36.8 C) (11/19 0813) Pulse Rate:  [65-72] 72 (11/19 0813) Resp:  [16-20] 16 (11/19 0813) BP: (113-149)/(65-78) 125/65 (11/19 0813) SpO2:  [99 %-100 %] 99 % (11/19 0813) Blood pressure 125/65, pulse 72, temperature 98.2 F (36.8 C), resp. rate 16, height 6' (1.829 m), weight 88 kg, SpO2 99%. Body mass index is 26.3 kg/m.  Physical Exam Vitals and nursing note reviewed.  HENT:     Head: Normocephalic and  atraumatic.  Pulmonary:     Effort: Pulmonary effort is normal.  Neurological:     General: No focal deficit present.     Mental Status: He is alert.     Mental Status Exam: General Appearance: Hooked up to continuous EEG, mother is present in the room, patient is likely staring often throughout the interview  Orientation:  Full (Time, Place, and Person)  Memory: Grossly intact based on conversation from yesterday that patient was able to recall  Concentration:  Concentration: Fair and Attention Span: Fair  Recall: Intact per conversation  Attention intact per formal testing including months backwards and weekdays backwards  Eye Contact:  Fair  Speech:  Slow  Language:  Good  Volume:  Decreased  Mood: stressed  Affect: Restricted  Thought Process: Minimal so difficult to assess thought process  Thought Content: Appears to be preoccupied but not clearly responding to stimuli.  Denies AVH.  Does not describing delusions during conversation.  Poor insight into current condition  Suicidal Thoughts:  No  Homicidal Thoughts:  No  Judgement: Fair, is agreeable to treatment despite not understanding was going on  Insight: Poor, thinks he is back to his normal self  Psychomotor Activity:  Normal  Akathisia:  No  Fund of Knowledge:  Fair  Assets:  Communication Skills  Cognition:  Impaired,  Mild  ADL's:  Impaired  AIMS (if indicated):        Other History   These have been pulled in through the EMR, reviewed, and updated if appropriate.  Family History:  The patient's family history is not on file.  Medical History: History reviewed. No pertinent past medical history.  Surgical History: History reviewed. No pertinent surgical history.   Medications:   Current Facility-Administered Medications:    acetaminophen  (TYLENOL ) tablet 650 mg, 650 mg, Oral, Q6H PRN, 650 mg at 11/13/24 1738 **OR** acetaminophen  (TYLENOL ) suppository 650 mg, 650 mg, Rectal, Q6H PRN, Celinda Alm Lot, MD   haloperidol  (HALDOL ) tablet 2 mg, 2 mg, Oral, BID, Akintayo, Musa A, MD, 2 mg at 11/14/24 0940   haloperidol  lactate (HALDOL ) injection 2 mg, 2 mg, Intramuscular, Q8H PRN, Akintayo, Musa A, MD   LORazepam  (ATIVAN ) injection 1 mg, 1 mg, Intramuscular, Q8H PRN, Akintayo, Musa A, MD   ondansetron  (ZOFRAN ) tablet 4 mg, 4 mg, Oral, Q6H PRN **OR** ondansetron  (ZOFRAN ) injection 4 mg, 4 mg, Intravenous, Q6H PRN, Celinda Alm Lot, MD   [COMPLETED] thiamine (VITAMIN B1) 500 mg in sodium chloride  0.9 % 50 mL IVPB, 500 mg, Intravenous, Q8H, Last Rate: 110 mL/hr at 11/12/24 2124, 500 mg at 11/12/24 2124 **FOLLOWED BY** thiamine (VITAMIN B1) 250 mg in sodium chloride  0.9 % 50 mL IVPB, 250 mg, Intravenous, Daily, Last Rate: 105 mL/hr at 11/13/24 0931, 250 mg at 11/13/24 0931 **FOLLOWED BY** [START ON 11/19/2024] thiamine (VITAMIN B1) injection 100 mg, 100 mg, Intravenous, Daily, Khaliqdina, Salman, MD  Allergies: No Known Allergies  Gary Cornish, MD

## 2024-11-14 NOTE — Progress Notes (Signed)
 Spoke with Leita from [facility]. Report given. All questions if any answered.

## 2024-11-14 NOTE — Group Note (Signed)

## 2024-11-14 NOTE — Progress Notes (Signed)
 Pt admitted from [facility] ED currently denies SI/HI AVH. States he doesn't remember if he was having AVH when he came in but doesn't  think he needs to be here. Pt states he has had SI thought in the past but never had a plan or any intention of acting on them  states  I love life. Pt oriented to unit and all questions answered.      11/14/24 1417  Psych Admission Type (Psych Patients Only)  Admission Status Voluntary  Psychosocial Assessment  Patient Complaints Other (Comment) (Pt stated   I think I do not need to be here)  Eye Contact Fair  Facial Expression Animated  Affect Inconsistent with thought content  Speech Soft;Tangential  Interaction Childlike;Avoidant;Guarded  Motor Activity Other (Comment) (WNL)  Appearance/Hygiene Unremarkable  Behavior Characteristics Guarded  Mood Pleasant;Apprehensive  Thought Process  Coherency Disorganized  Content WDL  Delusions WDL  Perception WDL  Hallucination None reported or observed  Judgment Impaired  Confusion Mild  Danger to Self  Current suicidal ideation? Denies  Danger to Others  Danger to Others None reported or observed

## 2024-11-15 DIAGNOSIS — F23 Brief psychotic disorder: Secondary | ICD-10-CM

## 2024-11-15 LAB — IGG CSF INDEX
Albumin CSF-mCnc: 10 mg/dL (ref 10–45)
Albumin: 4.6 g/dL (ref 4.3–5.2)
CSF IgG Index: 0.6 (ref 0.0–0.7)
IgG (Immunoglobin G), Serum: 813 mg/dL (ref 603–1613)
IgG, CSF: 1 mg/dL (ref 0.0–10.3)
IgG/Alb Ratio, CSF: 0.1 (ref 0.00–0.25)

## 2024-11-15 LAB — MISC LABCORP TEST (SEND OUT): Labcorp test code: 505625

## 2024-11-15 LAB — CYTOMEGALOVIRUS DNA, QUANTITATIVE REAL-TIME PCR, PLASMA
CMV DNA Quant: NEGATIVE [IU]/mL
Log10 CMV Qn DNA Pl: UNDETERMINED {Log_IU}/mL

## 2024-11-15 MED ORDER — ARIPIPRAZOLE 5 MG PO TABS
5.0000 mg | ORAL_TABLET | Freq: Every day | ORAL | Status: DC
  Administered 2024-11-15 – 2024-11-18 (×4): 5 mg via ORAL
  Filled 2024-11-15 (×4): qty 1

## 2024-11-15 NOTE — H&P (Signed)
 Psychiatric Admission Assessment Adult  Patient Identification: Gary Rojas MRN:  [id number] Date of Evaluation:  11/15/2024 Chief Complaint:  Brief psychotic disorder (HCC) [F23]   History of Present Illness:  Patient is a 23 year old male who is medically admitted on 1115 and transferred to the behavioral health overnight.  He was admitted for altered mental status with no previous psychiatric diagnosis with a medical history significant for multiple concussions from football.  Psychiatry was initially consulted to rule out somatoform disorder or supratentorial component of patient's presentation.  Psychiatric consult provider called patient presentation was consistent with brief psychotic disorder and extensive medical workup was performed including ruling out rare conditions for encephalopathy including autoimmune encephalitis, heavy metal toxicity, ATC.  Workup included head CT, MRI, and LP with additional lab work including ESR, renal function panel, C-reactive protein, procalcitonin, magnesium, IgG CSF index, CSF oligoclonal bands, CMV DNA quantitive PCR, CSF fungus and anaerobic culture, VDRL CSF, CFS culture with Gram stain and differential, CSF cell count with differential, CMP, CBC, B1, RPR, folate, and B12.  Consult provider noted presentation consisted of altered mental status auditory hallucinations paranoia, changes in behavior, internal preoccupation, slow and circumstantial thought process, with paucity of thought content which they report began several days ago in the context of work stressors, marijuana use, history of head injury, recent travel, and isolation.  They found this to be most consistent with acute psychotic disorder.  Patient had no previous psychiatric history no previous psychotropic medication trials.  He had initially denied SI, HI, and AVH.  They found that he was awake and alert with no psychomotor abnormalities speech volume was decreased.  They also note  that he had some response to low-dose of Haldol  and recommended continued consideration of switching to more tolerable antipsychotic for long-term use.  Grandfather came and saw patient during medical admission and felt that he had improved following the initiation of the antipsychotic but that he was not back at baseline.  The consult team noticed improvement in attention and memory.  He tolerated the Haldol  without adverse effect.  The consult provider obtained lateral information from the patient's father which indicated patient had been experiencing memory gaps, probably conversations since he returned from [place]  approximately 5 days ago where he visited his brother.  Father also reported history of football and THC use.  Other significant history indicated that patient moved out of the home 3 weeks ago and began living by himself.  On exam today patient was alert and oriented to self and location.  He had some confusion about the situation.  He continued to be quite circumstantial.  He was positive for hallucinations he denied depression and suicidal ideations.  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  He believed that he was enrolled in the program by his employer that does not exist and they were just testing him to see what he would do.  He believes that he is going to go to jail.  He is unable to elaborate further on why he has these beliefs.  He requires frequent redirection on exam.  He is a poor historian.  He declined collateral contact indicating that he he did not want staff speaking with parents and then indicated that they could speak with parents.  He denied EPS.  Endorses recent marijuana and alcohol use but is unable to quantify.  Denied history of previous psychiatric hospitalization.  He is unable to discuss his  living situation or his new address.    Total Time spent with patient: 1 hour Sleep   Sleep:Sleep: Fair  Psychiatric History:  Information collected from patient and chart review   Prev Dx/Sx: denies  Current Psych Provider: none Home Meds (current): none Previous Med Trials: none Therapy: denies   Prior Psych Hospitalization: denies  Prior Self Harm: denies Prior Violence: denies   Family Psych History: denies Family Hx suicide: denies   Social History:  Educational Hx: graduated high school Occupational Hx: Working as a Curator Hx: Unknown Living Situation: used to live with parents but moved out 3 weeks ago.  Spiritual Hx: unsure Access to weapons/lethal means: patient denies     Substance History Alcohol: socially  Type of alcohol beer Last Drink patient does not know Number of drinks per day patient doe not know History of alcohol withdrawal seizures denies History of DT's denies Tobacco: denies Illicit drugs: Yes, Vapes THC.  Prescription drug abuse: Denies  Rehab hx: denies  Columbia Scale:  Flowsheet Row Admission (Current) from 11/14/2024 in [facility] INPAT[facility]ICINE ED to Hosp-Admission (Discharged) from 11/10/2024 in [place] [facility] [place] [facility]  C-SSRS RISK CATEGORY No Risk No Risk     Past Medical History: History reviewed. No pertinent past medical history. History reviewed. No pertinent surgical history. Family History: History reviewed. No pertinent family history.  Social History:  Social History   Substance and Sexual Activity  Alcohol Use No     Social History   Substance and Sexual Activity  Drug Use Never      Allergies:  No Known Allergies Lab Results:  Results for orders placed or performed during the hospital encounter of 11/10/24 (from the past 48 hours)  Basic metabolic panel with GFR     Status: None   Collection Time: 11/14/24  1:20 AM  Result Value Ref Range   Sodium 137 135 - 145 mmol/L   Potassium 4.2 3.5 - 5.1 mmol/L   Chloride 101 98 - 111 mmol/L   CO2 26 22 - 32 mmol/L    Glucose, Bld 90 70 - 99 mg/dL    Comment: Glucose reference range applies only to samples taken after fasting for at least 8 hours.   BUN 14 6 - 20 mg/dL   Creatinine, Ser 8.82 0.61 - 1.24 mg/dL   Calcium 9.5 8.9 - 10.3 mg/dL   GFR, Estimated >39 >39 mL/min    Comment: (NOTE) Calculated using the CKD-EPI Creatinine Equation (2021)    Anion gap 10 5 - 15    Comment: Performed at [facility], [address], [place], [place] [address]    Blood Alcohol level:  Lab Results  Component Value Date   [facility] <15 11/10/2024    Metabolic Disorder Labs:  No results found for: HGBA1C, MPG No results found for: PROLACTIN No results found for: CHOL, TRIG, HDL, CHOLHDL, VLDL, LDLCALC  Current Medications: Current Facility-Administered Medications  Medication Dose Route Frequency Provider Last Rate Last Admin   acetaminophen  (TYLENOL ) tablet 650 mg  650 mg Oral Q6H PRN McCarty, Artie, MD   650 mg at 11/14/24 1541   alum & mag hydroxide-simeth (MAALOX/MYLANTA) 200-200-20 MG/5ML suspension 30 mL  30 mL Oral Q4H PRN McCarty, Artie, MD       ARIPiprazole  (ABILIFY ) tablet 5 mg  5 mg Oral Daily Macyn Remmert E, PA-C   5 mg at 11/15/24 1456   haloperidol  (HALDOL ) tablet 5 mg  5 mg Oral TID  PRN McCarty, Artie, MD       And   diphenhydrAMINE (BENADRYL) capsule 50 mg  50 mg Oral TID PRN McCarty, Artie, MD       haloperidol  lactate (HALDOL ) injection 5 mg  5 mg Intramuscular TID PRN McCarty, Artie, MD       And   diphenhydrAMINE (BENADRYL) injection 50 mg  50 mg Intramuscular TID PRN McCarty, Artie, MD       And   LORazepam  (ATIVAN ) injection 2 mg  2 mg Intramuscular TID PRN McCarty, Artie, MD       haloperidol  lactate (HALDOL ) injection 10 mg  10 mg Intramuscular TID PRN McCarty, Artie, MD       And   diphenhydrAMINE (BENADRYL) injection 50 mg  50 mg Intramuscular TID PRN McCarty, Artie, MD       And   LORazepam  (ATIVAN ) injection 2 mg  2 mg Intramuscular TID PRN McCarty,  Artie, MD       hydrOXYzine (ATARAX) tablet 25 mg  25 mg Oral TID PRN McCarty, Artie, MD   25 mg at 11/14/24 2110   magnesium hydroxide (MILK OF MAGNESIA) suspension 30 mL  30 mL Oral Daily PRN McCarty, Artie, MD       traZODone (DESYREL) tablet 50 mg  50 mg Oral QHS PRN McCarty, Artie, MD   50 mg at 11/14/24 2111   PTA Medications: Medications Prior to Admission  Medication Sig Dispense Refill Last Dose/Taking   acetaminophen  (TYLENOL ) 325 MG tablet Take 2 tablets (650 mg total) by mouth every 6 (six) hours as needed for mild pain (pain score 1-3) or fever (or Fever >/= 101).      thiamine (VITAMIN B1) 100 MG tablet Take 1 tablet (100 mg total) by mouth daily.       Psychiatric Specialty Exam:  Presentation  General Appearance: Casual  Eye Contact:Fleeting  Speech:Normal Rate  Speech Volume:Normal    Mood and Affect  Mood:No data recorded Affect:Flat   Thought Process  Thought Processes:Disorganized  Descriptions of Associations:Circumstantial  Orientation:Full (Time, Place and Person)  Thought Content:Delusions; Illogical; Paranoid Ideation  Hallucinations:Hallucinations: Auditory  Ideas of Reference:Paranoia  Suicidal Thoughts:Suicidal Thoughts: No  Homicidal Thoughts:Homicidal Thoughts: No   Sensorium  Memory:Immediate Poor  Judgment:Impaired  Insight:Poor   Executive Functions  Concentration:Fair  Attention Span:Fair  Recall:Poor  Fund of Knowledge:No data recorded Language:No data recorded  Psychomotor Activity  Psychomotor Activity:Psychomotor Activity: Normal   Assets  Assets:Desire for Improvement; Housing; Vocational/Educational; Social Support    Musculoskeletal: Strength & Muscle Tone: within normal limits Gait & Station: normal  Physical Exam: Physical Exam Vitals and nursing note reviewed.  HENT:     Head: Atraumatic.  Eyes:     Extraocular Movements: Extraocular movements intact.  Pulmonary:     Effort: Pulmonary  effort is normal.  Neurological:     Mental Status: He is alert.    Review of Systems  Constitutional:  Negative for chills and fever.  Eyes:  Negative for blurred vision and photophobia.  Respiratory:  Negative for cough.   Genitourinary:  Negative for dysuria.  Skin:  Negative for rash.  Neurological:  Positive for headaches. Negative for dizziness, tremors, sensory change, speech change, focal weakness, seizures and weakness.  Psychiatric/Behavioral:  Positive for hallucinations and substance abuse. Negative for depression and suicidal ideas. The patient is nervous/anxious. The patient does not have insomnia.    Blood pressure 118/70, pulse 67, temperature 98 F (36.7 C), temperature source Oral, resp. rate 14, height  6' (1.829 m), weight 81.6 kg, SpO2 100%. Body mass index is 24.41 kg/m.  Principal Diagnosis: Brief psychotic disorder (HCC) Diagnosis:  Principal Problem:   Brief psychotic disorder [facility])   Clinical Decision Making: Patient is a 23 year old male with no formal psychiatric history, a medical history significant for multiple concussions from football, and recent marijuana and alcohol use, who was medically admitted on 11/15 for altered mental status and transferred to behavioral health following extensive negative medical workup for encephalopathy, including neuroimaging, lumbar puncture, infectious, inflammatory, metabolic, and toxicologic studies. Psychiatry consultation determined his presentation was most consistent with an acute psychotic disorder or brief psychotic disorder given several days of confusion, auditory hallucinations, paranoia, internal preoccupation, circumstantial thought process, and paucity of thought content in the setting of psychosocial stressors, recent travel, history of head injury, and cannabis use. He demonstrated partial improvement with low-dose haloperidol  without adverse effects, though family members reported he was not at baseline during  the medical admission.  On current evaluation, patient remains alert and oriented to self and location but continues to exhibit confusion about circumstances, significant circumstantiality, poor historical reliability, and persistent psychotic symptoms including hallucinations, paranoia, and fixed false beliefs involving his job and perceived legal consequences. He is unable to articulate a coherent rationale for these beliefs and requires frequent redirection. He denies depression, SI, HI, and EPS. He remains unable to provide details about his living situation and inconsistently grants permission for collateral contact, further supporting impaired insight and cognitive disorganization. Given the acuity of symptoms, absence of prior psychiatric treatment history, partial response to antipsychotic therapy, and exclusion of medical etiologies, his presentation remains most consistent with an acute psychotic disorder, and requires continued inpatient stabilization, medication management, and safety monitoring.  Treatment Plan Summary:  Safety and Monitoring:             -- Voluntary admission to inpatient psychiatric unit for safety, stabilization and treatment             -- Daily contact with patient to assess and evaluate symptoms and progress in treatment             -- Patient's case to be discussed in multi-disciplinary team meeting             -- Observation Level: q15 minute checks             -- Vital signs:  q12 hours             -- Precautions: suicide, elopement, and assault   2. Psychiatric Diagnoses and Treatment:              Discontinued Haldol  will start Abilify 5 mg daily   -- The risks/benefits/side-effects/alternatives to this medication were discussed in detail with the patient and time was given for questions. The patient consents to medication trial.                -- Metabolic profile and EKG monitoring obtained while on an atypical antipsychotic (BMI: Lipid Panel: HbgA1c:  QTc:)              -- Encouraged patient to participate in unit milieu and in scheduled group therapies                            3. Medical Issues Being Addressed:  Some of his medical workup is still pending we will continue to monitor further resulting labs  -- LP with CSF analysis  pending preliminary negative --Serum Autoimmune encephalopathy panel pending - Continuous EEG negative for epilepsy - RPR negative --Heavy metal pending -- ESR and CRP normal --TSH normal 6 months ago, repeat pending -- most recent EKG on 11/10/2024 had QtC of 415 --UDS positive for THC only --B12 normal, folate borderline low 5.9 --B1 level pending -- Pertinent labwork reviewed earlier this admission includes: Glucose 182. Imaging: CT head without contrast with no acute intracranial normality.   MRI brain without contrast no acute intracranial abnormality.  No mass or abnormal enhancement.  MRI brain with and without contrast 11/15: Normal  4. Discharge Planning:              -- Social work and case management to assist with discharge planning and identification of hospital follow-up needs prior to discharge             -- Estimated LOS: 5-7 days             -- Discharge Concerns: Need to establish a safety plan; Medication compliance and effectiveness             -- Discharge Goals: Return home with outpatient referrals follow ups  Physician Treatment Plan for Primary Diagnosis: Brief psychotic disorder (HCC) Long Term Goal(s): Improvement in symptoms so as ready for discharge  Short Term Goals: Compliance with prescribed medications will improve  I have reviewed this case with Dr. Jadapalle who is agreeable with this plan.  I certify that inpatient services furnished can reasonably be expected to improve the patient's condition.    Donnice FORBES Right, PA-C 11/20/20253:53 PM

## 2024-11-15 NOTE — [facility] Suicide Risk Assessment (Signed)
 [facility] Admission Suicide Risk Assessment   Nursing information obtained from:  Patient Demographic factors:  Male Current Mental Status:  NA Loss Factors:  NA Historical Factors:  NA Risk Reduction Factors:  Employed  Total Time spent with patient: 1 hour Principal Problem: Brief psychotic disorder (HCC) Diagnosis:  Principal Problem:   Brief psychotic disorder (HCC)  Subjective Data: Patient is a 23 year old male with no formal psychiatric history, a medical history significant for multiple concussions from football, and recent marijuana and alcohol use, who was medically admitted on 11/15 for altered mental status and transferred to behavioral health following extensive negative medical workup for encephalopathy, including neuroimaging, lumbar puncture, infectious, inflammatory, metabolic, and toxicologic studies. Psychiatry consultation determined his presentation was most consistent with an acute psychotic disorder or brief psychotic disorder given several days of confusion, auditory hallucinations, paranoia, internal preoccupation, circumstantial thought process, and paucity of thought content in the setting of psychosocial stressors, recent travel, history of head injury, and cannabis use. He demonstrated partial improvement with low-dose haloperidol  without adverse effects, though family members reported he was not at baseline during the medical admission.  On current evaluation, patient remains alert and oriented to self and location but continues to exhibit confusion about circumstances, significant circumstantiality, poor historical reliability, and persistent psychotic symptoms including hallucinations, paranoia, and fixed false beliefs involving his job and perceived legal consequences. He is unable to articulate a coherent rationale for these beliefs and requires frequent redirection. He denies depression, SI, HI, and EPS. He remains unable to provide details about his living situation and  inconsistently grants permission for collateral contact, further supporting impaired insight and cognitive disorganization. Given the acuity of symptoms, absence of prior psychiatric treatment history, partial response to antipsychotic therapy, and exclusion of medical etiologies, his presentation remains most consistent with an acute psychotic disorder, and requires continued inpatient stabilization, medication management, and safety monitoring.  Continued Clinical Symptoms:  Alcohol Use Disorder Identification Test Final Score (AUDIT): 4 The Alcohol Use Disorders Identification Test, Guidelines for Use in Primary Care, Second Edition.  World Science Writer [facility]). Score between 0-7:  no or low risk or alcohol related problems. Score between 8-15:  moderate risk of alcohol related problems. Score between 16-19:  high risk of alcohol related problems. Score 20 or above:  warrants further diagnostic evaluation for alcohol dependence and treatment.   CLINICAL FACTORS:   Currently Psychotic   Musculoskeletal: Strength & Muscle Tone: within normal limits Gait & Station: normal Patient leans: N/A  Psychiatric Specialty Exam:  Presentation  General Appearance:  Casual  Eye Contact: Fleeting  Speech: Normal Rate  Speech Volume: Normal  Handedness:No data recorded  Mood and Affect  Mood:No data recorded Affect: Flat   Thought Process  Thought Processes: Disorganized  Descriptions of Associations:Circumstantial  Orientation:Full (Time, Place and Person)  Thought Content:Delusions; Illogical; Paranoid Ideation  History of Schizophrenia/Schizoaffective disorder:No  Duration of Psychotic Symptoms:Less than six months  Hallucinations:Hallucinations: Auditory  Ideas of Reference:Paranoia  Suicidal Thoughts:Suicidal Thoughts: No  Homicidal Thoughts:Homicidal Thoughts: No   Sensorium  Memory: Immediate  Poor  Judgment: Impaired  Insight: Poor   Executive Functions  Concentration: Fair  Attention Span: Fair  Recall: Poor  Fund of Knowledge:No data recorded Language:No data recorded  Psychomotor Activity  Psychomotor Activity: Psychomotor Activity: Normal   Assets  Assets: Desire for Improvement; Housing; Vocational/Educational; Social Support   Sleep  Sleep: Sleep: Fair    Physical Exam: Physical Exam ROS Blood pressure 118/70, pulse 67, temperature 98 F (36.7  C), temperature source Oral, resp. rate 14, height 6' (1.829 m), weight 81.6 kg, SpO2 100%. Body mass index is 24.41 kg/m.   COGNITIVE FEATURES THAT CONTRIBUTE TO RISK:  None    SUICIDE RISK:   Minimal: No identifiable suicidal ideation.  Patients presenting with no risk factors but with morbid ruminations; may be classified as minimal risk based on the severity of the depressive symptoms  PLAN OF CARE: 1.    Safety and Monitoring:   --  Voluntary admission to inpatient psychiatric unit for safety, stabilization and treatment -- Daily contact with patient to assess and evaluate symptoms and progress in treatment -- Patient's case to be discussed in multi-disciplinary team meeting -- Observation Level : q15 minute checks -- Vital signs:  q12 hours -- Precautions: suicide   I certify that inpatient services furnished can reasonably be expected to improve the patient's condition.   Donnice FORBES Right, PA-C 11/15/2024, 3:54 PM

## 2024-11-15 NOTE — Progress Notes (Signed)
   11/15/24 1500  Psych Admission Type (Psych Patients Only)  Admission Status Voluntary  Psychosocial Assessment  Patient Complaints None  Eye Contact Fair  Facial Expression Animated  Affect Inconsistent with thought content  Speech Tangential;Soft  Interaction Avoidant  Motor Activity Other (Comment) (WNL)  Appearance/Hygiene Unremarkable  Behavior Characteristics Guarded  Mood Apprehensive;Pleasant  Thought Process  Coherency Disorganized  Content WDL  Delusions None reported or observed  Perception WDL  Hallucination None reported or observed  Judgment Impaired  Confusion Mild  Danger to Self  Current suicidal ideation? Denies  Self-Injurious Behavior No self-injurious ideation or behavior indicators observed or expressed   Agreement Not to Harm Self Yes  Description of Agreement Verbal  Danger to Others  Danger to Others None reported or observed

## 2024-11-15 NOTE — Plan of Care (Signed)
  Problem: Education: Goal: Emotional status will improve Outcome: Progressing   Problem: Education: Goal: Mental status will improve Outcome: Progressing   Problem: Activity: Goal: Interest or engagement in activities will improve Outcome: Progressing   Problem: Safety: Goal: Periods of time without injury will increase Outcome: Progressing   Problem: Physical Regulation: Goal: Ability to maintain clinical measurements within normal limits will improve Outcome: Progressing   Problem: Health Behavior/Discharge Planning: Goal: Compliance with treatment plan for underlying cause of condition will improve Outcome: Progressing

## 2024-11-15 NOTE — [facility] Suicide Risk Assessment (Signed)
 [facility] INPATIENT:  Family/Significant Other Suicide Prevention Education  Suicide Prevention Education:  Patient Refusal for Family/Significant Other Suicide Prevention Education: The patient Gary Rojas has refused to provide written consent for family/significant other to be provided Family/Significant Other Suicide Prevention Education during admission and/or prior to discharge.  Physician notified.  SPE completed with pt, as pt refused to consent to family contact. SPI pamphlet provided to pt and pt was encouraged to share information with support network, ask questions, and talk about any concerns relating to SPE. Pt denies access to guns/firearms and verbalized understanding of information provided. Mobile Crisis information also provided to pt.  [name] Fam 11/15/2024, 11:02 AM

## 2024-11-15 NOTE — Plan of Care (Signed)
   Problem: Education: Goal: Mental status will improve Outcome: Progressing Goal: Verbalization of understanding the information provided will improve Outcome: Progressing   Problem: Activity: Goal: Interest or engagement in activities will improve Outcome: Progressing

## 2024-11-15 NOTE — Group Note (Signed)
 LCSW Group Therapy Note  Group Date: 11/15/2024 Start Time: 1300 End Time: 1400   Type of Therapy and Topic:  Group Therapy: Using I Statements  Participation Level:  Active  Description of Group:  Patients were asked to provide details of some interpersonal conflicts they have experienced. Patients were then educated about "I" statements, communication which focuses on feelings or views of the speaker rather than what the other person is doing. T group members were asked to reflect on past conflicts and to provide specific examples for utilizing "I" statements.  Therapeutic Goals:  Patients will verbalize understanding of ineffective communication and effective communication. Patients will be able to empathize with whom they are having conflict. Patients will practice effective communication in the form of "I" statements.    Summary of Patient Progress:  Patient shared their personal feelings about their current plans and endeavors. The patient was present and active throughout the session and proved open to feedback from CSW and peers. Patient demonstrated proficient insight into the subject matter, was respectful of peers, and was present throughout the entire session.  Therapeutic Modalities:   Cognitive Behavioral Therapy Solution-Focused Therapy    Alveta CHRISTELLA Kerns, LCSW 11/15/2024  2:05 PM

## 2024-11-15 NOTE — Group Note (Signed)
 Recreation Therapy Group Note   Group Topic:General Recreation  Group Date: 11/15/2024 Start Time: 1500 End Time: 1555 Facilitators: Celestia Jeoffrey BRAVO, LRT, CTRS Location: Courtyard  Group Description: [employer]. LRT and patients played games of basketball, drew with chalk, and played corn hole while outside in the courtyard while getting fresh air and sunlight. Music was being played in the background. LRT and peers conversed about different games they have played before, what they do in their free time and anything else that is on their minds. LRT encouraged pts to drink water after being outside, sweating and getting their heart rate up.  Goal Area(s) Addressed: Patient will build on frustration tolerance skills. Patients will partake in a competitive play game with peers. Patients will gain knowledge of new leisure interest/hobby.    Affect/Mood: N/A   Participation Level: Did not attend    Clinical Observations/Individualized Feedback: Patient did not attend group.   Plan: Continue to engage patient in RT group sessions 2-3x/week.   Jeoffrey BRAVO Celestia, LRT, CTRS 11/15/2024 4:56 PM

## 2024-11-15 NOTE — BHH Counselor (Signed)
 Adult Comprehensive Assessment  Patient ID: Gary Rojas, male   DOB: [birth year], 23 y.o.   MRN: [id number]  Information Source: Information source: Patient  Current Stressors:  Patient states their primary concerns and needs for treatment are:: I was at work and I got flustered at work. I announced that I smoked pot and supervisor called airport security. Pt reported that security brought him to the hospital. Patient states their goals for this hospitilization and ongoing recovery are:: Work on getting back home, work on getting released. Educational / Learning stressors: None reported Employment / Job issues: None reported Family Relationships: None reported Surveyor, Quantity / Lack of resources (include bankruptcy): Pt shared that he is not sure if he's got insurance or if he's on his parents' insurance. Housing / Lack of housing: None reported Physical health (include injuries & life threatening diseases): None reported Social relationships: None reported Substance abuse: He endorsed smoking marijuana. Bereavement / Loss: None reported but becomes tearful when talking about grief and loss.  Living/Environment/Situation:  Living Arrangements: Alone Living conditions (as described by patient or guardian): At my own spot. Who else lives in the home?: Pt lives by himself. How long has patient lived in current situation?: About a month and some changes ago. What is atmosphere in current home: Comfortable  Family History:  Marital status: Single Are you sexually active?: No What is your sexual orientation?: Straight. Has your sexual activity been affected by drugs, alcohol, medication, or emotional stress?: N/A Does patient have children?: No  Childhood History:  By whom was/is the patient raised?: Both parents Additional childhood history information: I had it made. Everything I ever wanted they gave me. Description of patient's relationship with caregiver when they  were a child: I loved them. Feel like we had a pretty good relationship. Patient's description of current relationship with people who raised him/her: I don't tell them everything so I don't know. How were you disciplined when you got in trouble as a child/adolescent?: Beat, not saying it like that. I was disciplined the right way. Does patient have siblings?: Yes Number of Siblings: 1 (Older brother) Description of patient's current relationship with siblings: We never really connected like that growing up but he was always looking out for me. Did patient suffer any verbal/emotional/physical/sexual abuse as a child?: No Did patient suffer from severe childhood neglect?: No Has patient ever been sexually abused/assaulted/raped as an adolescent or adult?: No Was the patient ever a victim of a crime or a disaster?: No Witnessed domestic violence?: No Has patient been affected by domestic violence as an adult?: No  Education:  Highest grade of school patient has completed: High school graduate with some college. Currently a student?: No Learning disability?: No  Employment/Work Situation:   Employment Situation: Employed Where is Patient Currently Employed?: [employer]. How Long has Patient Been Employed?: Since March 28, 2023. Are You Satisfied With Your Job?: Yes Do You Work More Than One Job?: No Patient's Job has Been Impacted by Current Illness: No What is the Longest Time Patient has Held a Job?: Two to two had a half years, three years maybe. Where was the Patient Employed at that Time?: Landscaping. Has Patient ever Been in the [employer]?: No  Financial Resources:   Financial resources: Income from employment Does patient have a representative payee or guardian?: No  Alcohol/Substance Abuse:   What has been your use of drugs/alcohol within the last 12 months?: Pt reported that he is using marijuana. He shared that he  uses delta-8 pen on the weekends. If  attempted suicide, did drugs/alcohol play a role in this?: No Alcohol/Substance Abuse Treatment Hx: Denies past history If yes, describe treatment: N/A Has alcohol/substance abuse ever caused legal problems?: No  Social Support System:   Patient's Community Support System: Fair Describe Community Support System: I got a family that loves me, I think. I don't want to say I think. Type of faith/religion: I believe God is real. How does patient's faith help to cope with current illness?: Praying, listening to worship music.  Leisure/Recreation:   Do You Have Hobbies?: Yes Leisure and Hobbies: No, not really. Play video games sometimes, maybe. Go out with friends.  Strengths/Needs:   What is the patient's perception of their strengths?: Pt was unable to identify strengths. Patient states they can use these personal strengths during their treatment to contribute to their recovery: N/A Patient states these barriers may affect/interfere with their treatment: Pt denied any barriers. Patient states these barriers may affect their return to the community: Pt denied any barriers.  Discharge Plan:   Currently receiving community mental health services: No Patient states concerns and preferences for aftercare planning are: Pt is open to referral. Patient states they will know when they are safe and ready for discharge when: I feel ready now. Does patient have access to transportation?: Yes Does patient have financial barriers related to discharge medications?: No Will patient be returning to same living situation after discharge?: Yes  Summary/Recommendations:   Summary and Recommendations (to be completed by the evaluator): Patient is a 23 year old, single, male from [place], [place] [facility] [place] He reported that he was brought to the hospital by airport security after becoming flustered at work and announcing that he smoked marijuana. Pt appeared to be confused and stuck in his  thoughts throughout the interaction. He shared that his goal is to "work on getting back home, work on getting released." Pt lives in his own place which he reported is owned by his grandparents and he just pays the bills. He endorsed plans to return there upon discharge. Pt denied any history of abuse or trauma. He reported that he smokes Delta 8 on weekends. He did acknowledge heavier use when he was in high school. He denied receiving any current mental health services in the community but is open to referrals upon discharge. Recommendations include: crisis stabilization, therapeutic milieu, encourage group attendance and participation, medication management for mood stabilization and development of a comprehensive mental wellness plan.  [name] Fam. 11/15/2024

## 2024-11-15 NOTE — Group Note (Signed)
 Date:  11/15/2024 Time:  8:41 PM  Group Topic/Focus:  Orientation:   The focus of this group is to educate the patient on the purpose and policies of crisis stabilization and provide a format to answer questions about their admission.  The group details unit policies and expectations of patients while admitted. Self Care:   The focus of this group is to help patients understand the importance of self-care in order to improve or restore emotional, physical, spiritual, interpersonal, and financial health.    Participation Level:  Active  Participation Quality:  Appropriate and Attentive  Affect:  Appropriate  Cognitive:  Alert and Appropriate  Insight: Appropriate and Good  Engagement in Group:  Improving  Modes of Intervention:  Clarification, Discussion, Education, Orientation, Rapport Building, and Support  Additional Comments:     Gary Rojas 11/15/2024, 8:41 PM

## 2024-11-15 NOTE — Group Note (Signed)
 Recreation Therapy Group Note   Group Topic:Stress Management  Group Date: 11/15/2024 Start Time: 1000 End Time: 1040 Facilitators: Celestia Jeoffrey BRAVO, LRT, CTRS Location: Dayroom  Group Description: PMR (Progressive Muscle Relaxation). LRT educates patients on what PMR is and the benefits that come from it. Patients are asked to sit with their feet flat on the floor while sitting up and all the way back in their chair, if possible. LRT and pts follow a prompt through a speaker that requires you to tense and release different muscles in their body and focus on their breathing. During session, lights are off and soft music is being played. Pts are given a stress ball to use if needed.   Goal Area(s) Addressed:  Patients Gary Rojas be able to describe progressive muscle relaxation.  Patient Gary Rojas practice using relaxation technique. Patient Gary Rojas identify a new coping skill.  Patient Gary Rojas follow multistep directions to reduce anxiety and stress.   Affect/Mood: Euthymic   Participation Level: Moderate    Clinical Observations/Individualized Feedback: Gary Rojas was present in group. Pt was noted to be getting up from his chair randomly during group and cleaning off the counter top. Pt began to ask LRT about going outside in the middle of group while the lights were off, patients were silent and everyone was following along to the prompt on the TV. Judgement seems limited, however pt is pleasant.   Plan: Continue to engage patient in RT group sessions 2-3x/week.   Jeoffrey BRAVO Celestia, LRT, CTRS 11/15/2024 11:47 AM

## 2024-11-16 LAB — ANAEROBIC CULTURE W GRAM STAIN: Gram Stain: NONE SEEN

## 2024-11-16 NOTE — Progress Notes (Signed)
   11/16/24 2200  Psych Admission Type (Psych Patients Only)  Admission Status Voluntary  Psychosocial Assessment  Patient Complaints None  Eye Contact Fair  Facial Expression Animated  Affect Anxious;Apprehensive  Speech Soft  Interaction Cautious;Minimal  Motor Activity Slow  Appearance/Hygiene Unremarkable  Behavior Characteristics Guarded  Mood Apprehensive;Pleasant  Thought Process  Coherency Blocking  Content Ambivalence  Delusions None reported or observed  Perception WDL  Hallucination None reported or observed  Judgment Impaired  Confusion Mild  Danger to Self  Current suicidal ideation? Denies  Agreement Not to Harm Self Yes  Description of Agreement verbal  Danger to Others  Danger to Others None reported or observed

## 2024-11-16 NOTE — Plan of Care (Signed)
  Problem: Activity: Goal: Sleeping patterns will improve Outcome: Progressing   

## 2024-11-16 NOTE — Group Note (Signed)
 Date:  11/16/2024 Time:  10:34 AM  Group Topic/Focus:  Dimensions of Wellness:   The focus of this group is to introduce the topic of wellness and discuss the role each dimension of wellness plays in total health.    Participation Level:  Active  Participation Quality:  Appropriate  Affect:  Appropriate  Cognitive:  Alert  Insight: Appropriate  Engagement in Group:  Engaged  Modes of Intervention:  Activity, Discussion, and Education  Additional Comments:    Skippy LITTIE Bennett 11/16/2024, 10:34 AM

## 2024-11-16 NOTE — Progress Notes (Signed)
   11/16/24 0900  Psych Admission Type (Psych Patients Only)  Admission Status Voluntary  Psychosocial Assessment  Patient Complaints None;Other (Comment) (Appears preoccupied, but either unwilling or unable to explain his concerns)  Eye Contact Fair  Facial Expression Animated  Affect Inconsistent with thought content  Speech Soft  Interaction Childlike;Minimal  Motor Activity Other (Comment) (WNL)  Appearance/Hygiene Unremarkable  Behavior Characteristics Guarded  Mood Apprehensive;Pleasant  Thought Process  Coherency Disorganized  Content WDL  Delusions None reported or observed  Perception WDL  Hallucination None reported or observed  Judgment Impaired  Confusion Mild  Danger to Self  Current suicidal ideation? Denies  Self-Injurious Behavior No self-injurious ideation or behavior indicators observed or expressed   Agreement Not to Harm Self Yes  Description of Agreement Verbal  Danger to Others  Danger to Others None reported or observed

## 2024-11-16 NOTE — Group Note (Signed)
 Recreation Therapy Group Note   Group Topic:Leisure Education  Group Date: 11/16/2024 Start Time: 1010 End Time: 1115 Facilitators: Celestia Jeoffrey BRAVO, LRT, CTRS Location: Craft Room  Group Description: Leisure. Patients were given the option to choose from journaling, coloring, drawing, making origami, playing with playdoh, listening to music or singing karaoke. LRT and pts discussed the meaning of leisure, the importance of participating in leisure during their free time/when they're outside of the hospital, as well as how our leisure interests can also serve as coping skills.   Goal Area(s) Addressed:  Patient Gary Rojas identify a current leisure interest.  Patient Gary Rojas learn the definition of "leisure". Patient Gary Rojas practice making a positive decision. Patient Gary Rojas have the opportunity to try a new leisure activity. Patient Gary Rojas communicate with peers and LRT.    Affect/Mood: Appropriate   Participation Level: Active and Engaged   Participation Quality: Independent   Behavior: Calm and Cooperative   Speech/Thought Process: Coherent   Insight: Good   Judgement: Good   Modes of Intervention: Clarification, Education, Exploration, and Music   Patient Response to Interventions:  Attentive, Engaged, and Receptive   Education Outcome:  Acknowledges education   Clinical Observations/Individualized Feedback: Gary Rojas was active in their participation of session activities and group discussion. Pt identified golf and play video games as things he does in his free time.    Plan: Continue to engage patient in RT group sessions 2-3x/week.   Jeoffrey BRAVO Celestia, LRT, CTRS 11/16/2024 11:35 AM

## 2024-11-16 NOTE — Plan of Care (Signed)
  Problem: Education: Goal: Emotional status will improve Outcome: Progressing   Problem: Education: Goal: Mental status will improve Outcome: Progressing   Problem: Activity: Goal: Interest or engagement in activities will improve Outcome: Progressing   Problem: Activity: Goal: Sleeping patterns will improve Outcome: Progressing   Problem: Coping: Goal: Ability to verbalize frustrations and anger appropriately will improve Outcome: Not Progressing  Gionni appears preoccupied. When asked if there is anything this RN can do to help, he is either unable or unwilling to disclose his concern. Either way, he is not yet progressing with this problem.

## 2024-11-16 NOTE — Progress Notes (Signed)
 [facility] MD Progress Note  11/16/2024 4:33 PM Gary Rojas  MRN:  [id number]   Subjective:  Chart reviewed, case discussed in multidisciplinary meeting, patient seen during rounds.   Patient seen for follow-up he is alert and oriented.  He is more linear today.  He has been medication compliant he had some nausea overnight but is not reporting nausea or headache today.  Reports he met with his family last night.  He denies SI, HI, and AVH.  He is not required any behavioral PRNs.  He reports stable appetite and sleep.  He voices no concerns or complaints.  Spoke with his father over the phone who indicates he feels patient continues to improve and that they have come up with a plan for him to move back in.  Patient was agreeable with the plan to move back in.  Patient and family are agreeable with outpatient follow-up.  Discussed that we will continue to monitor given recent medications which nausea and is an initial altered mental status.    Past Psychiatric History: see h&P Family History: History reviewed. No pertinent family history. Social History:  Social History   Substance and Sexual Activity  Alcohol Use No     Social History   Substance and Sexual Activity  Drug Use Never    Social History   Socioeconomic History   Marital status: Single    Spouse name: Not on file   Number of children: Not on file   Years of education: Not on file   Highest education level: Not on file  Occupational History   Not on file  Tobacco Use   Smoking status: Never   Smokeless tobacco: Never  Vaping Use   Vaping status: Never Used  Substance and Sexual Activity   Alcohol use: No   Drug use: Never   Sexual activity: Not on file  Other Topics Concern   Not on file  Social History Narrative   Not on file   Social Drivers of Health   Financial Resource Strain: Low Risk  (04/30/2024)   Received from [facility]   Overall Financial Resource Strain (CARDIA)     Difficulty of Paying Living Expenses: Not hard at all  Food Insecurity: No Food Insecurity (11/14/2024)   Hunger Vital Sign    Worried About Running Out of Food in the Last Year: Never true    Ran Out of Food in the Last Year: Never true  Transportation Needs: No Transportation Needs (11/14/2024)   PRAPARE - Administrator, Civil Service (Medical): No    Lack of Transportation (Non-Medical): No  Physical Activity: Not on file  Stress: Not on file  Social Connections: Not on file   Past Medical History: History reviewed. No pertinent past medical history. History reviewed. No pertinent surgical history.  Current Medications: Current Facility-Administered Medications  Medication Dose Route Frequency Provider Last Rate Last Admin   acetaminophen  (TYLENOL ) tablet 650 mg  650 mg Oral Q6H PRN McCarty, Artie, MD   650 mg at 11/14/24 1541   alum & mag hydroxide-simeth (MAALOX/MYLANTA) 200-200-20 MG/5ML suspension 30 mL  30 mL Oral Q4H PRN McCarty, Artie, MD       ARIPiprazole  (ABILIFY ) tablet 5 mg  5 mg Oral Daily Rosan Calbert E, PA-C   5 mg at 11/16/24 0902   haloperidol  (HALDOL ) tablet 5 mg  5 mg Oral TID PRN McCarty, Artie, MD       And   diphenhydrAMINE  (BENADRYL ) capsule 50  mg  50 mg Oral TID PRN McCarty, Artie, MD       haloperidol  lactate (HALDOL ) injection 5 mg  5 mg Intramuscular TID PRN McCarty, Artie, MD       And   diphenhydrAMINE  (BENADRYL ) injection 50 mg  50 mg Intramuscular TID PRN McCarty, Artie, MD       And   LORazepam  (ATIVAN ) injection 2 mg  2 mg Intramuscular TID PRN McCarty, Artie, MD       haloperidol  lactate (HALDOL ) injection 10 mg  10 mg Intramuscular TID PRN McCarty, Artie, MD       And   diphenhydrAMINE  (BENADRYL ) injection 50 mg  50 mg Intramuscular TID PRN McCarty, Artie, MD       And   LORazepam  (ATIVAN ) injection 2 mg  2 mg Intramuscular TID PRN McCarty, Artie, MD       hydrOXYzine  (ATARAX ) tablet 25 mg  25 mg Oral TID PRN McCarty, Artie,  MD   25 mg at 11/14/24 2110   magnesium  hydroxide (MILK OF MAGNESIA) suspension 30 mL  30 mL Oral Daily PRN McCarty, Artie, MD       traZODone  (DESYREL ) tablet 50 mg  50 mg Oral QHS PRN McCarty, Artie, MD   50 mg at 11/14/24 2111    Lab Results: No results found for this or any previous visit (from the past 48 hours).  Blood Alcohol level:  Lab Results  Component Value Date   [facility] <15 11/10/2024    Metabolic Disorder Labs: No results found for: HGBA1C, MPG No results found for: PROLACTIN No results found for: CHOL, TRIG, HDL, CHOLHDL, VLDL, LDLCALC  Physical Findings: AIMS:  , ,  ,  ,    CIWA:    COWS:      Psychiatric Specialty Exam:  Presentation  General Appearance:  Casual  Eye Contact: Fleeting  Speech: Normal Rate  Speech Volume: Normal    Mood and Affect  Mood:No data recorded Affect: Flat   Thought Process  Thought Processes: Disorganized  Orientation:Full (Time, Place and Person)  Thought Content:Delusions; Illogical; Paranoid Ideation  Hallucinations:Hallucinations: Auditory  Ideas of Reference:Paranoia  Suicidal Thoughts:Suicidal Thoughts: No  Homicidal Thoughts:Homicidal Thoughts: No   Sensorium  Memory: Immediate Poor  Judgment: Impaired  Insight: Poor   Executive Functions  Concentration: Fair  Attention Span: Fair  Recall: Poor  Fund of Knowledge:No data recorded Language:No data recorded  Psychomotor Activity  Psychomotor Activity: Psychomotor Activity: Normal  Musculoskeletal: Strength & Muscle Tone: within normal limits Gait & Station: normal Assets  Assets: Desire for Improvement; Housing; Vocational/Educational; Social Support    Physical Exam: Physical Exam Vitals and nursing note reviewed.  HENT:     Head: Atraumatic.  Eyes:     Extraocular Movements: Extraocular movements intact.  Pulmonary:     Effort: Pulmonary effort is normal.  Neurological:     Mental Status: He  is alert and oriented to person, place, and time.    Review of Systems  Psychiatric/Behavioral:  Negative for depression, hallucinations, substance abuse and suicidal ideas. The patient is nervous/anxious.    Blood pressure 127/67, pulse 72, temperature 98.6 F (37 C), temperature source Oral, resp. rate 18, height 6' (1.829 m), weight 81.6 kg, SpO2 99%. Body mass index is 24.41 kg/m.  Diagnosis: Principal Problem:   Brief psychotic disorder [facility])   PLAN: Safety and Monitoring:  -- Voluntary admission to inpatient psychiatric unit for safety, stabilization and treatment  -- Daily contact with patient to assess and evaluate symptoms  and progress in treatment  -- Patient's case to be discussed in multi-disciplinary team meeting  -- Observation Level : q15 minute checks  -- Vital signs:  q12 hours  -- Precautions: suicide, elopement, and assault -- Encouraged patient to participate in unit milieu and in scheduled group therapies  2. Psychiatric Treatment:  Scheduled Medications:    Continue Abilify  5 mg daily -- The risks/benefits/side-effects/alternatives to this medication were discussed in detail with the patient and time was given for questions. The patient consents to medication trial.  3. Medical Issues Being Addressed:   No acute concerns extensive medical workup prior to transfer to behavioral health.  4. Discharge Planning:   -- Social work and case management to assist with discharge planning and identification of hospital follow-up needs prior to discharge  -- Estimated LOS: 3-4 days  Donnice FORBES Right, PA-C 11/16/2024, 4:33 PM

## 2024-11-16 NOTE — Group Note (Signed)
 Recreation Therapy Group Note   Group Topic:Other  Group Date: 11/16/2024 Start Time: 1530 End Time: 1620 Facilitators: Celestia Jeoffrey BRAVO, LRT, CTRS Location: Courtyard  Group Description: [employer]. LRT and patients played games of basketball, drew with chalk, and played corn hole while outside in the courtyard while getting fresh air and sunlight. Music was being played in the background. LRT and peers conversed about different games they have played before, what they do in their free time and anything else that is on their minds. LRT encouraged pts to drink water after being outside, sweating and getting their heart rate up.  Goal Area(s) Addressed: Patient Gary Rojas build on frustration tolerance skills. Patients Gary Rojas partake in a competitive play game with peers. Patients Gary Rojas gain knowledge of new leisure interest/hobby.    Affect/Mood: Appropriate   Participation Level: Active   Participation Quality: Independent   Behavior: Appropriate   Speech/Thought Process: Coherent   Insight: Fair   Judgement: Fair    Modes of Intervention: Activity   Patient Response to Interventions:  Receptive   Education Outcome:  Acknowledges education   Clinical Observations/Individualized Feedback: Gary Rojas was active in their participation of session activities and group discussion. Pt interacted well with LRT and peers duration of session.    Plan: Continue to engage patient in RT group sessions 2-3x/week.   Jeoffrey BRAVO Celestia, LRT, CTRS 11/16/2024 5:15 PM

## 2024-11-16 NOTE — Group Note (Signed)
 Date:  11/16/2024 Time:  8:35 PM  Group Topic/Focus:  Wrap-Up Group:   The focus of this group is to help patients review their daily goal of treatment and discuss progress on daily workbooks.    Participation Level:  Active  Participation Quality:  Appropriate, Attentive, and Sharing  Affect:  Appropriate  Cognitive:  Appropriate  Insight: Appropriate  Engagement in Group:  Engaged  Modes of Intervention:  Discussion  Additional Comments:     Kerri Katz 11/16/2024, 8:35 PM

## 2024-11-16 NOTE — BH IP Treatment Plan (Signed)
 Interdisciplinary Treatment and Diagnostic Plan Update  11/16/2024 Time of Session: 13:29 Gary Rojas MRN: [id number]  Principal Diagnosis: Brief psychotic disorder [facility])  Secondary Diagnoses: Principal Problem:   Brief psychotic disorder (HCC)   Current Medications:  Current Facility-Administered Medications  Medication Dose Route Frequency Provider Last Rate Last Admin   acetaminophen  (TYLENOL ) tablet 650 mg  650 mg Oral Q6H PRN McCarty, Artie, MD   650 mg at 11/14/24 1541   alum & mag hydroxide-simeth (MAALOX/MYLANTA) 200-200-20 MG/5ML suspension 30 mL  30 mL Oral Q4H PRN McCarty, Artie, MD       ARIPiprazole  (ABILIFY ) tablet 5 mg  5 mg Oral Daily Millington, Matthew E, PA-C   5 mg at 11/16/24 0902   haloperidol  (HALDOL ) tablet 5 mg  5 mg Oral TID PRN McCarty, Artie, MD       And   diphenhydrAMINE  (BENADRYL ) capsule 50 mg  50 mg Oral TID PRN McCarty, Artie, MD       haloperidol  lactate (HALDOL ) injection 5 mg  5 mg Intramuscular TID PRN McCarty, Artie, MD       And   diphenhydrAMINE  (BENADRYL ) injection 50 mg  50 mg Intramuscular TID PRN McCarty, Artie, MD       And   LORazepam  (ATIVAN ) injection 2 mg  2 mg Intramuscular TID PRN McCarty, Artie, MD       haloperidol  lactate (HALDOL ) injection 10 mg  10 mg Intramuscular TID PRN McCarty, Artie, MD       And   diphenhydrAMINE  (BENADRYL ) injection 50 mg  50 mg Intramuscular TID PRN McCarty, Artie, MD       And   LORazepam  (ATIVAN ) injection 2 mg  2 mg Intramuscular TID PRN McCarty, Artie, MD       hydrOXYzine  (ATARAX ) tablet 25 mg  25 mg Oral TID PRN McCarty, Artie, MD   25 mg at 11/14/24 2110   magnesium  hydroxide (MILK OF MAGNESIA) suspension 30 mL  30 mL Oral Daily PRN McCarty, Artie, MD       traZODone  (DESYREL ) tablet 50 mg  50 mg Oral QHS PRN McCarty, Artie, MD   50 mg at 11/14/24 2111   PTA Medications: Medications Prior to Admission  Medication Sig Dispense Refill Last Dose/Taking   acetaminophen  (TYLENOL ) 325  MG tablet Take 2 tablets (650 mg total) by mouth every 6 (six) hours as needed for mild pain (pain score 1-3) or fever (or Fever >/= 101).      thiamine  (VITAMIN B1) 100 MG tablet Take 1 tablet (100 mg total) by mouth daily.       Patient Stressors: Substance abuse    Patient Strengths: Active sense of humor   Treatment Modalities: Medication Management, Group therapy, Case management,  1 to 1 session with clinician, Psychoeducation, Recreational therapy.   Physician Treatment Plan for Primary Diagnosis: Brief psychotic disorder (HCC) Long Term Goal(s): Improvement in symptoms so as ready for discharge   Short Term Goals: Compliance with prescribed medications will improve  Medication Management: Evaluate patient's response, side effects, and tolerance of medication regimen.  Therapeutic Interventions: 1 to 1 sessions, Unit Group sessions and Medication administration.  Evaluation of Outcomes: Progressing  Physician Treatment Plan for Secondary Diagnosis: Principal Problem:   Brief psychotic disorder (HCC)  Long Term Goal(s): Improvement in symptoms so as ready for discharge   Short Term Goals: Compliance with prescribed medications will improve     Medication Management: Evaluate patient's response, side effects, and tolerance of medication regimen.  Therapeutic Interventions:  1 to 1 sessions, Unit Group sessions and Medication administration.  Evaluation of Outcomes: Progressing   RN Treatment Plan for Primary Diagnosis: Brief psychotic disorder (HCC) Long Term Goal(s): Knowledge of disease and therapeutic regimen to maintain health will improve  Short Term Goals: Ability to remain free from injury will improve, Ability to verbalize frustration and anger appropriately will improve, Ability to demonstrate self-control, Ability to participate in decision making will improve, Ability to verbalize feelings will improve, Ability to disclose and discuss suicidal ideas, Ability to  identify and develop effective coping behaviors will improve, and Compliance with prescribed medications will improve  Medication Management: RN will administer medications as ordered by provider, will assess and evaluate patient's response and provide education to patient for prescribed medication. RN will report any adverse and/or side effects to prescribing provider.  Therapeutic Interventions: 1 on 1 counseling sessions, Psychoeducation, Medication administration, Evaluate responses to treatment, Monitor vital signs and CBGs as ordered, Perform/monitor CIWA, COWS, AIMS and Fall Risk screenings as ordered, Perform wound care treatments as ordered.  Evaluation of Outcomes: Progressing   LCSW Treatment Plan for Primary Diagnosis: Brief psychotic disorder [facility]) Long Term Goal(s): Safe transition to appropriate next level of care at discharge, Engage patient in therapeutic group addressing interpersonal concerns.  Short Term Goals: Engage patient in aftercare planning with referrals and resources, Increase social support, Increase ability to appropriately verbalize feelings, Increase emotional regulation, Facilitate acceptance of mental health diagnosis and concerns, Facilitate patient progression through stages of change regarding substance use diagnoses and concerns, Identify triggers associated with mental health/substance abuse issues, and Increase skills for wellness and recovery  Therapeutic Interventions: Assess for all discharge needs, 1 to 1 time with Social worker, Explore available resources and support systems, Assess for adequacy in community support network, Educate family and significant other(s) on suicide prevention, Complete Psychosocial Assessment, Interpersonal group therapy.  Evaluation of Outcomes: Progressing   Progress in Treatment: Attending groups: Yes. and No. Participating in groups: Yes. Taking medication as prescribed: Yes. Toleration medication:  Yes. Family/Significant other contact made: No, will contact:  mother or father.  Patient understands diagnosis: Yes. Discussing patient identified problems/goals with staff: Yes. Medical problems stabilized or resolved: Yes. Denies suicidal/homicidal ideation: Yes. Issues/concerns per patient self-inventory: No. Other: none.  New problem(s) identified: No, Describe:  none identified.  New Short Term/Long Term Goal(s): elimination of symptoms of psychosis, medication management for mood stabilization; elimination of SI thoughts; development of comprehensive mental wellness/sobriety plan.  Patient Goals:  Get discharged. Getting a follow up.   Discharge Plan or Barriers: CSW will assist pt with development of an appropriate aftercare/discharge plan.   Reason for Continuation of Hospitalization: Delusions  Hallucinations Medication stabilization  Estimated Length of Stay: 1-7 days  Last 3 [place] Suicide Severity Risk Score: Flowsheet Row Admission (Current) from 11/14/2024 in [facility] ED to Hosp-Admission (Discharged) from 11/10/2024 in [facility]  C-SSRS RISK CATEGORY No Risk No Risk    Last PHQ 2/9 Scores:     No data to display          Scribe for Treatment Team: [name] Fam, LCSW 11/16/2024 4:07 PM

## 2024-11-16 NOTE — Plan of Care (Signed)
  Problem: Education: Goal: Emotional status will improve Outcome: Progressing   Problem: Education: Goal: Mental status will improve Outcome: Progressing   Problem: Safety: Goal: Periods of time without injury will increase Outcome: Progressing

## 2024-11-17 LAB — OLIGOCLONAL BANDS, CSF + SERM

## 2024-11-17 NOTE — Group Note (Signed)
 Date:  11/17/2024 Time:  8:30 PM  Group Topic/Focus:  Making Healthy Choices:   The focus of this group is to help patients identify negative/unhealthy choices they were using prior to admission and identify positive/healthier coping strategies to replace them upon discharge. Self Care:   The focus of this group is to help patients understand the importance of self-care in order to improve or restore emotional, physical, spiritual, interpersonal, and financial health. Wrap-Up Group:   The focus of this group is to help patients review their daily goal of treatment and discuss progress on daily workbooks.    Participation Level:  Active  Participation Quality:  Appropriate and Attentive  Affect:  Appropriate  Cognitive:  Alert, Appropriate, and Oriented  Insight: Appropriate and Good  Engagement in Group:  Engaged  Modes of Intervention:  Discussion and Support  Additional Comments:  N/A  Gary Rojas 11/17/2024, 8:30 PM

## 2024-11-17 NOTE — Plan of Care (Signed)
  Problem: Education: Goal: Emotional status will improve Outcome: Progressing Goal: Verbalization of understanding the information provided will improve Outcome: Progressing   Problem: Activity: Goal: Interest or engagement in activities will improve Outcome: Progressing Goal: Sleeping patterns will improve Outcome: Progressing   Problem: Coping: Goal: Ability to verbalize frustrations and anger appropriately will improve Outcome: Progressing Goal: Ability to demonstrate self-control will improve Outcome: Progressing   Problem: Education: Goal: Mental status will improve Outcome: Not Progressing   Problem: Health Behavior/Discharge Planning: Goal: Compliance with treatment plan for underlying cause of condition will improve Outcome: Not Progressing

## 2024-11-17 NOTE — Group Note (Signed)
 [facility] LCSW Group Therapy Note   Group Date: 11/17/2024 Start Time: 1330 End Time: 1430   Type of Therapy/Topic:  Group Therapy:  Emotion Regulation  Participation Level:  Active   Mood:  Description of Group:    The purpose of this group is to assist patients in learning to regulate negative emotions and experience positive emotions. Patients will be guided to discuss ways in which they have been vulnerable to their negative emotions. These vulnerabilities will be juxtaposed with experiences of positive emotions or situations, and patients challenged to use positive emotions to combat negative ones. Special emphasis will be placed on coping with negative emotions in conflict situations, and patients will process healthy conflict resolution skills.  Therapeutic Goals: Patient will identify two positive emotions or experiences to reflect on in order to balance out negative emotions:  Patient will label two or more emotions that they find the most difficult to experience:  Patient will be able to demonstrate positive conflict resolution skills through discussion or role plays:   Summary of Patient Progress:   Patient was attentive and engaged during today's group session    Therapeutic Modalities:   Cognitive Behavioral Therapy Feelings Identification Dialectical Behavioral Therapy   Aldo CHRISTELLA Niece, LCSW

## 2024-11-17 NOTE — Progress Notes (Signed)
   11/17/24 0924  Psych Admission Type (Psych Patients Only)  Admission Status Voluntary  Psychosocial Assessment  Patient Complaints None  Eye Contact Fair  Facial Expression Animated  Affect Apprehensive  Speech Soft  Interaction Cautious  Motor Activity Slow  Appearance/Hygiene Unremarkable  Behavior Characteristics Guarded  Mood Apprehensive;Suspicious  Aggressive Behavior  Effect No apparent injury  Thought Process  Coherency Blocking  Content Paranoia  Delusions Paranoid  Perception WDL  Hallucination None reported or observed  Judgment Impaired  Confusion WDL  Danger to Self  Current suicidal ideation? Denies  Self-Injurious Behavior No self-injurious ideation or behavior indicators observed or expressed   Agreement Not to Harm Self Yes  Description of Agreement verbal  Danger to Others  Danger to Others None reported or observed

## 2024-11-17 NOTE — Progress Notes (Signed)
   11/16/24 2200  Psych Admission Type (Psych Patients Only)  Admission Status Voluntary  Psychosocial Assessment  Patient Complaints None  Eye Contact Fair  Facial Expression Animated  Affect Anxious;Apprehensive  Speech Soft  Interaction Cautious;Minimal  Motor Activity Slow  Appearance/Hygiene Unremarkable  Behavior Characteristics Guarded  Mood Apprehensive;Pleasant  Thought Process  Coherency Blocking  Content Ambivalence  Delusions None reported or observed  Perception WDL  Hallucination None reported or observed  Judgment Impaired  Confusion Mild  Danger to Self  Current suicidal ideation? Denies  Agreement Not to Harm Self Yes  Description of Agreement verbal  Danger to Others  Danger to Others None reported or observed

## 2024-11-17 NOTE — Progress Notes (Signed)
 [facility] MD Progress Note  11/17/2024 3:22 PM Gary Rojas  MRN:  [id number]   Subjective:  Chart reviewed, case discussed in multidisciplinary meeting, patient seen during rounds.   11/22: Patient seen for follow up they declined their morning medication but took it later in the day. Notes he was worried about getting a headache discussed that had noted decreased headaches since switching to abilify  and that staff and his family have noted improvement in his mentation since starting the medication. He was agreeable with continuing the medication. Continue to encourage to perform adls. Spoke with mother who plans to come visit and feels that he is improving. She is agreeable with him moving back in. He denies SI/HI/AVH. Is still experiencing thought blocking but continues to show improvement. Denies Headache and nausea today.   11/21: Patient seen for follow-up he is alert and oriented.  He is more linear today.  He has been medication compliant he had some nausea overnight but is not reporting nausea or headache today.  Reports he met with his family last night.  He denies SI, HI, and AVH.  He is not required any behavioral PRNs.  He reports stable appetite and sleep.  He voices no concerns or complaints.  Spoke with his father over the phone who indicates he feels patient continues to improve and that they have come up with a plan for him to move back in.  Patient was agreeable with the plan to move back in.  Patient and family are agreeable with outpatient follow-up.  Discussed that we will continue to monitor given recent medications which nausea and is an initial altered mental status.    Past Psychiatric History: see h&P Family History: History reviewed. No pertinent family history. Social History:  Social History   Substance and Sexual Activity  Alcohol Use No     Social History   Substance and Sexual Activity  Drug Use Never    Social History   Socioeconomic History    Marital status: Single    Spouse name: Not on file   Number of children: Not on file   Years of education: Not on file   Highest education level: Not on file  Occupational History   Not on file  Tobacco Use   Smoking status: Never   Smokeless tobacco: Never  Vaping Use   Vaping status: Never Used  Substance and Sexual Activity   Alcohol use: No   Drug use: Never   Sexual activity: Not on file  Other Topics Concern   Not on file  Social History Narrative   Not on file   Social Drivers of Health   Financial Resource Strain: Low Risk  (04/30/2024)   Received from [facility]   Overall Financial Resource Strain (CARDIA)    Difficulty of Paying Living Expenses: Not hard at all  Food Insecurity: No Food Insecurity (11/14/2024)   Hunger Vital Sign    Worried About Running Out of Food in the Last Year: Never true    Ran Out of Food in the Last Year: Never true  Transportation Needs: No Transportation Needs (11/14/2024)   PRAPARE - Administrator, Civil Service (Medical): No    Lack of Transportation (Non-Medical): No  Physical Activity: Not on file  Stress: Not on file  Social Connections: Not on file   Past Medical History: History reviewed. No pertinent past medical history. History reviewed. No pertinent surgical history.  Current Medications: Current Facility-Administered Medications  Medication  Dose Route Frequency Provider Last Rate Last Admin   acetaminophen  (TYLENOL ) tablet 650 mg  650 mg Oral Q6H PRN McCarty, Artie, MD   650 mg at 11/14/24 1541   alum & mag hydroxide-simeth (MAALOX/MYLANTA) 200-200-20 MG/5ML suspension 30 mL  30 mL Oral Q4H PRN McCarty, Artie, MD       ARIPiprazole  (ABILIFY ) tablet 5 mg  5 mg Oral Daily Jessa Stinson E, PA-C   5 mg at 11/16/24 9097   haloperidol  (HALDOL ) tablet 5 mg  5 mg Oral TID PRN McCarty, Artie, MD       And   diphenhydrAMINE  (BENADRYL ) capsule 50 mg  50 mg Oral TID PRN McCarty, Artie, MD        haloperidol  lactate (HALDOL ) injection 5 mg  5 mg Intramuscular TID PRN McCarty, Artie, MD       And   diphenhydrAMINE  (BENADRYL ) injection 50 mg  50 mg Intramuscular TID PRN McCarty, Artie, MD       And   LORazepam  (ATIVAN ) injection 2 mg  2 mg Intramuscular TID PRN McCarty, Artie, MD       haloperidol  lactate (HALDOL ) injection 10 mg  10 mg Intramuscular TID PRN McCarty, Artie, MD       And   diphenhydrAMINE  (BENADRYL ) injection 50 mg  50 mg Intramuscular TID PRN McCarty, Artie, MD       And   LORazepam  (ATIVAN ) injection 2 mg  2 mg Intramuscular TID PRN McCarty, Artie, MD       hydrOXYzine  (ATARAX ) tablet 25 mg  25 mg Oral TID PRN McCarty, Artie, MD   25 mg at 11/14/24 2110   magnesium  hydroxide (MILK OF MAGNESIA) suspension 30 mL  30 mL Oral Daily PRN McCarty, Artie, MD       traZODone  (DESYREL ) tablet 50 mg  50 mg Oral QHS PRN McCarty, Artie, MD   50 mg at 11/14/24 2111    Lab Results: No results found for this or any previous visit (from the past 48 hours).  Blood Alcohol level:  Lab Results  Component Value Date   [facility] <15 11/10/2024    Metabolic Disorder Labs: No results found for: HGBA1C, MPG No results found for: PROLACTIN No results found for: CHOL, TRIG, HDL, CHOLHDL, VLDL, LDLCALC  Physical Findings: AIMS:  , ,  ,  ,    CIWA:    COWS:      Psychiatric Specialty Exam:  Presentation  General Appearance:  Casual  Eye Contact: Fleeting  Speech: Normal Rate  Speech Volume: Normal    Mood and Affect  Mood:No data recorded Affect: Flat   Thought Process  Thought Processes: Disorganized  Orientation:Full (Time, Place and Person)  Thought Content:Delusions; Illogical; Paranoid Ideation  Hallucinations:No data recorded  Ideas of Reference:Paranoia  Suicidal Thoughts:No data recorded  Homicidal Thoughts:No data recorded   Sensorium  Memory: Immediate Poor  Judgment: Impaired  Insight: Poor   Executive Functions   Concentration: Fair  Attention Span: Fair  Recall: Poor  Fund of Knowledge:No data recorded Language:No data recorded  Psychomotor Activity  Psychomotor Activity: No data recorded  Musculoskeletal: Strength & Muscle Tone: within normal limits Gait & Station: normal Assets  Assets: Desire for Improvement; Housing; Vocational/Educational; Social Support    Physical Exam: Physical Exam Vitals and nursing note reviewed.  HENT:     Head: Atraumatic.  Eyes:     Extraocular Movements: Extraocular movements intact.  Pulmonary:     Effort: Pulmonary effort is normal.  Neurological:  Mental Status: He is alert and oriented to person, place, and time.    Review of Systems  Psychiatric/Behavioral:  Negative for depression, hallucinations, substance abuse and suicidal ideas. The patient is nervous/anxious.    Blood pressure 128/78, pulse 63, temperature 98.6 F (37 C), temperature source Oral, resp. rate 16, height 6' (1.829 m), weight 81.6 kg, SpO2 99%. Body mass index is 24.41 kg/m.  Diagnosis: Principal Problem:   Brief psychotic disorder (HCC)   PLAN: Safety and Monitoring:  -- Voluntary admission to inpatient psychiatric unit for safety, stabilization and treatment  -- Daily contact with patient to assess and evaluate symptoms and progress in treatment  -- Patient's case to be discussed in multi-disciplinary team meeting  -- Observation Level : q15 minute checks  -- Vital signs:  q12 hours  -- Precautions: suicide, elopement, and assault -- Encouraged patient to participate in unit milieu and in scheduled group therapies  2. Psychiatric Treatment:  Scheduled Medications:    Continue Abilify  5 mg daily -- The risks/benefits/side-effects/alternatives to this medication were discussed in detail with the patient and time was given for questions. The patient consents to medication trial.  3. Medical Issues Being Addressed:   No acute concerns extensive medical  workup prior to transfer to behavioral health.  4. Discharge Planning:   -- Social work and case management to assist with discharge planning and identification of hospital follow-up needs prior to discharge  -- Estimated LOS: 3-4 days  Donnice FORBES Right, PA-C 11/17/2024, 3:22 PM

## 2024-11-17 NOTE — Progress Notes (Deleted)
   11/17/24 0924  Psych Admission Type (Psych Patients Only)  Admission Status Voluntary  Psychosocial Assessment  Patient Complaints None  Eye Contact Fair  Facial Expression Animated  Affect Apprehensive  Speech Soft  Interaction Cautious  Motor Activity Slow  Appearance/Hygiene Unremarkable  Behavior Characteristics Guarded  Mood Apprehensive;Suspicious  Aggressive Behavior  Effect No apparent injury  Thought Process  Coherency Blocking  Content Paranoia  Delusions Paranoid  Perception WDL  Hallucination None reported or observed  Judgment Impaired  Confusion Mild  Danger to Self  Current suicidal ideation? Denies  Self-Injurious Behavior No self-injurious ideation or behavior indicators observed or expressed   Agreement Not to Harm Self Yes  Description of Agreement verbal  Danger to Others  Danger to Others None reported or observed

## 2024-11-18 MED ORDER — ARIPIPRAZOLE 10 MG PO TABS
10.0000 mg | ORAL_TABLET | Freq: Every day | ORAL | Status: DC
  Administered 2024-11-19 – 2024-11-21 (×3): 10 mg via ORAL
  Filled 2024-11-18 (×3): qty 1

## 2024-11-18 NOTE — Progress Notes (Signed)
 [facility] MD Progress Note  11/18/2024 12:53 PM Gary Rojas  MRN:  [id number]   Subjective:  Chart reviewed, case discussed in multidisciplinary meeting, patient seen during rounds.   11/23: Is more able to hold a conversation. He is still thought blocking at times and is easily derailed. Denies ongoing headaches and nausea. Denies adverse effects of medication. He is medication compliant. Will increase Abilify  to 10 mg daily, patient is agreeable. He is linear and oriented to self, location, date, and president. He is unclear about the situation at his work that led to admission. He reports sleep, appetite, and mood are stable.   11/22: Patient seen for follow up they declined their morning medication but took it later in the day. Notes he was worried about getting a headache discussed that had noted decreased headaches since switching to abilify  and that staff and his family have noted improvement in his mentation since starting the medication. He was agreeable with continuing the medication. Continue to encourage to perform adls. Spoke with mother who plans to come visit and feels that he is improving. She is agreeable with him moving back in. He denies SI/HI/AVH. Is still experiencing thought blocking but continues to show improvement. Denies Headache and nausea today.   11/21: Patient seen for follow-up he is alert and oriented.  He is more linear today.  He has been medication compliant he had some nausea overnight but is not reporting nausea or headache today.  Reports he met with his family last night.  He denies SI, HI, and AVH.  He is not required any behavioral PRNs.  He reports stable appetite and sleep.  He voices no concerns or complaints.  Spoke with his father over the phone who indicates he feels patient continues to improve and that they have come up with a plan for him to move back in.  Patient was agreeable with the plan to move back in.  Patient and family are agreeable with  outpatient follow-up.  Discussed that we will continue to monitor given recent medications which nausea and is an initial altered mental status.    Past Psychiatric History: see h&P Family History: History reviewed. No pertinent family history. Social History:  Social History   Substance and Sexual Activity  Alcohol Use No     Social History   Substance and Sexual Activity  Drug Use Never    Social History   Socioeconomic History   Marital status: Single    Spouse name: Not on file   Number of children: Not on file   Years of education: Not on file   Highest education level: Not on file  Occupational History   Not on file  Tobacco Use   Smoking status: Never   Smokeless tobacco: Never  Vaping Use   Vaping status: Never Used  Substance and Sexual Activity   Alcohol use: No   Drug use: Never   Sexual activity: Not on file  Other Topics Concern   Not on file  Social History Narrative   Not on file   Social Drivers of Health   Financial Resource Strain: Low Risk  (04/30/2024)   Received from [facility]   Overall Financial Resource Strain (CARDIA)    Difficulty of Paying Living Expenses: Not hard at all  Food Insecurity: No Food Insecurity (11/14/2024)   Hunger Vital Sign    Worried About Running Out of Food in the Last Year: Never true    Ran Out of Food  in the Last Year: Never true  Transportation Needs: No Transportation Needs (11/14/2024)   PRAPARE - Administrator, Civil Service (Medical): No    Lack of Transportation (Non-Medical): No  Physical Activity: Not on file  Stress: Not on file  Social Connections: Not on file   Past Medical History: History reviewed. No pertinent past medical history. History reviewed. No pertinent surgical history.  Current Medications: Current Facility-Administered Medications  Medication Dose Route Frequency Provider Last Rate Last Admin   acetaminophen  (TYLENOL ) tablet 650 mg  650 mg Oral Q6H  PRN McCarty, Artie, MD   650 mg at 11/17/24 2118   alum & mag hydroxide-simeth (MAALOX/MYLANTA) 200-200-20 MG/5ML suspension 30 mL  30 mL Oral Q4H PRN McCarty, Artie, MD       ARIPiprazole  (ABILIFY ) tablet 5 mg  5 mg Oral Daily Izela Altier E, PA-C   5 mg at 11/18/24 9160   haloperidol  (HALDOL ) tablet 5 mg  5 mg Oral TID PRN McCarty, Artie, MD       And   diphenhydrAMINE  (BENADRYL ) capsule 50 mg  50 mg Oral TID PRN McCarty, Artie, MD       haloperidol  lactate (HALDOL ) injection 5 mg  5 mg Intramuscular TID PRN McCarty, Artie, MD       And   diphenhydrAMINE  (BENADRYL ) injection 50 mg  50 mg Intramuscular TID PRN McCarty, Artie, MD       And   LORazepam  (ATIVAN ) injection 2 mg  2 mg Intramuscular TID PRN McCarty, Artie, MD       haloperidol  lactate (HALDOL ) injection 10 mg  10 mg Intramuscular TID PRN McCarty, Artie, MD       And   diphenhydrAMINE  (BENADRYL ) injection 50 mg  50 mg Intramuscular TID PRN McCarty, Artie, MD       And   LORazepam  (ATIVAN ) injection 2 mg  2 mg Intramuscular TID PRN McCarty, Artie, MD       hydrOXYzine  (ATARAX ) tablet 25 mg  25 mg Oral TID PRN McCarty, Artie, MD   25 mg at 11/14/24 2110   magnesium  hydroxide (MILK OF MAGNESIA) suspension 30 mL  30 mL Oral Daily PRN McCarty, Artie, MD       traZODone  (DESYREL ) tablet 50 mg  50 mg Oral QHS PRN McCarty, Artie, MD   50 mg at 11/14/24 2111    Lab Results: No results found for this or any previous visit (from the past 48 hours).  Blood Alcohol level:  Lab Results  Component Value Date   [facility] <15 11/10/2024    Metabolic Disorder Labs: No results found for: HGBA1C, MPG No results found for: PROLACTIN No results found for: CHOL, TRIG, HDL, CHOLHDL, VLDL, LDLCALC  Physical Findings: AIMS:  , ,  ,  ,    CIWA:    COWS:      Psychiatric Specialty Exam:  Presentation  General Appearance:  Casual  Eye Contact: Fleeting  Speech: Normal Rate  Speech Volume: Normal    Mood and  Affect  Mood:No data recorded Affect: Flat   Thought Process  Thought Processes: Disorganized  Orientation:Full (Time, Place and Person)  Thought Content:Delusions; Illogical; Paranoid Ideation  Hallucinations:No data recorded  Ideas of Reference:Paranoia  Suicidal Thoughts:No data recorded  Homicidal Thoughts:No data recorded   Sensorium  Memory: Immediate Poor  Judgment: Impaired  Insight: Poor   Executive Functions  Concentration: Fair  Attention Span: Fair  Recall: Poor  Fund of Knowledge:No data recorded Language:No data recorded  Psychomotor  Activity  Psychomotor Activity: No data recorded  Musculoskeletal: Strength & Muscle Tone: within normal limits Gait & Station: normal Assets  Assets: Desire for Improvement; Housing; Vocational/Educational; Social Support    Physical Exam: Physical Exam Vitals and nursing note reviewed.  HENT:     Head: Atraumatic.  Eyes:     Extraocular Movements: Extraocular movements intact.  Pulmonary:     Effort: Pulmonary effort is normal.  Neurological:     Mental Status: He is alert and oriented to person, place, and time.    Review of Systems  Psychiatric/Behavioral:  Negative for depression, hallucinations, substance abuse and suicidal ideas. The patient is nervous/anxious.    Blood pressure 128/71, pulse 66, temperature 98.2 F (36.8 C), temperature source Oral, resp. rate 17, height 6' (1.829 m), weight 81.6 kg, SpO2 100%. Body mass index is 24.41 kg/m.  Diagnosis: Principal Problem:   Brief psychotic disorder (HCC)   PLAN: Safety and Monitoring:  -- Voluntary admission to inpatient psychiatric unit for safety, stabilization and treatment  -- Daily contact with patient to assess and evaluate symptoms and progress in treatment  -- Patient's case to be discussed in multi-disciplinary team meeting  -- Observation Level : q15 minute checks  -- Vital signs:  q12 hours  -- Precautions:  suicide, elopement, and assault -- Encouraged patient to participate in unit milieu and in scheduled group therapies  2. Psychiatric Treatment:  Scheduled Medications:    Increase abilify  to 10 mg daily -- The risks/benefits/side-effects/alternatives to this medication were discussed in detail with the patient and time was given for questions. The patient consents to medication trial.  3. Medical Issues Being Addressed:   No acute concerns extensive medical workup prior to transfer to behavioral health.  4. Discharge Planning:   -- mid week if continues to progress  -- Social work and case management to assist with discharge planning and identification of hospital follow-up needs prior to discharge  -- Estimated LOS: 3-4 days  Donnice FORBES Right, PA-C 11/18/2024, 12:53 PM

## 2024-11-18 NOTE — Group Note (Signed)
 Date:  11/18/2024 Time:  9:36 PM  Group Topic/Focus:  Spirituality:   The focus of this group is to discuss how one's spirituality can aide in recovery.    Participation Level:  Active  Participation Quality:  Appropriate  Affect:  Appropriate  Cognitive:  Appropriate  Insight: Appropriate  Engagement in Group:  Engaged  Modes of Intervention:  Activity  Additional Comments:    Camellia HERO Dontarius Sheley 11/18/2024, 9:36 PM

## 2024-11-18 NOTE — Progress Notes (Signed)
   11/17/24 2100  Psych Admission Type (Psych Patients Only)  Admission Status Voluntary  Psychosocial Assessment  Patient Complaints None  Eye Contact Fair  Facial Expression Animated  Affect Appropriate to circumstance  Speech Soft  Interaction Minimal  Motor Activity Other (Comment) (WDL)  Appearance/Hygiene Unremarkable  Behavior Characteristics Appropriate to situation;Guarded  Mood Pleasant  Aggressive Behavior  Effect No apparent injury  Thought Process  Coherency WDL  Content Paranoia  Delusions None reported or observed  Perception WDL  Hallucination None reported or observed  Judgment Limited  Confusion None  Danger to Self  Current suicidal ideation? Denies  Agreement Not to Harm Self Yes  Description of Agreement Verbal  Danger to Others  Danger to Others None reported or observed

## 2024-11-18 NOTE — Plan of Care (Signed)
   Problem: Education: Goal: Knowledge of Gary Rojas General Education information/materials will improve Outcome: Progressing Goal: Emotional status will improve Outcome: Progressing Goal: Mental status will improve Outcome: Progressing Goal: Verbalization of understanding the information provided will improve Outcome: Progressing   Problem: Activity: Goal: Interest or engagement in activities will improve Outcome: Progressing Goal: Sleeping patterns will improve Outcome: Progressing   Problem: Coping: Goal: Ability to verbalize frustrations and anger appropriately will improve Outcome: Progressing Goal: Ability to demonstrate self-control will improve Outcome: Progressing

## 2024-11-18 NOTE — Plan of Care (Signed)
  Problem: Education: Goal: Emotional status will improve Outcome: Progressing Goal: Mental status will improve Outcome: Progressing Goal: Verbalization of understanding the information provided will improve Outcome: Progressing   Problem: Activity: Goal: Interest or engagement in activities will improve Outcome: Progressing Goal: Sleeping patterns will improve Outcome: Progressing   Problem: Health Behavior/Discharge Planning: Goal: Identification of resources available to assist in meeting health care needs will improve Outcome: Progressing   Problem: Physical Regulation: Goal: Ability to maintain clinical measurements within normal limits will improve Outcome: Progressing

## 2024-11-18 NOTE — Progress Notes (Signed)
   11/18/24 1000  Psych Admission Type (Psych Patients Only)  Admission Status Voluntary  Psychosocial Assessment  Patient Complaints None  Eye Contact Fair  Facial Expression Animated  Affect Appropriate to circumstance  Speech Soft  Interaction Cautious  Motor Activity Other (Comment) (WNL)  Appearance/Hygiene Unremarkable  Behavior Characteristics Cooperative;Appropriate to situation  Mood Threatening  Aggressive Behavior  Effect No apparent injury  Thought Process  Coherency Blocking  Content WDL  Delusions None reported or observed  Perception WDL  Hallucination None reported or observed  Judgment Impaired  Confusion None  Danger to Self  Current suicidal ideation? Denies  Self-Injurious Behavior No self-injurious ideation or behavior indicators observed or expressed   Agreement Not to Harm Self Yes  Description of Agreement verbal  Danger to Others  Danger to Others None reported or observed   Patient stated that his head is more clear now.

## 2024-11-18 NOTE — Group Note (Signed)
 Date:  11/18/2024 Time:  9:26 PM  Group Topic/Focus:  Activity Group: The focus of the group is to encourage patients to go outside to the courtyard and get some fresh air and some exercise.    Participation Level:  Active  Participation Quality:  Appropriate  Affect:  Appropriate  Cognitive:  Appropriate  Insight: Appropriate  Engagement in Group:  Engaged  Modes of Intervention:  Activity  Additional Comments:    Gary Rojas 11/18/2024, 9:26 PM

## 2024-11-18 NOTE — Group Note (Signed)
 Date:  11/18/2024 Time:  8:28 PM  Group Topic/Focus:  Making Healthy Choices:   The focus of this group is to help patients identify negative/unhealthy choices they were using prior to admission and identify positive/healthier coping strategies to replace them upon discharge. Managing Feelings:   The focus of this group is to identify what feelings patients have difficulty handling and develop a plan to handle them in a healthier way upon discharge. Self Care:   The focus of this group is to help patients understand the importance of self-care in order to improve or restore emotional, physical, spiritual, interpersonal, and financial health.    Participation Level:  Active  Participation Quality:  Appropriate and Attentive  Affect:  Appropriate  Cognitive:  Alert, Appropriate, and Oriented  Insight: Appropriate and Good  Engagement in Group:  Engaged  Modes of Intervention:  Discussion and Support  Additional Comments:  N/A  Butler LITTIE Gelineau 11/18/2024, 8:28 PM

## 2024-11-19 NOTE — Progress Notes (Signed)
   11/18/24 2200  Psych Admission Type (Psych Patients Only)  Admission Status Involuntary  Psychosocial Assessment  Patient Complaints None  Eye Contact Fair  Facial Expression Animated  Affect Appropriate to circumstance  Speech Soft  Interaction Minimal  Motor Activity Other (Comment) (WDL)  Appearance/Hygiene Unremarkable  Behavior Characteristics Cooperative;Appropriate to situation  Mood Pleasant  Aggressive Behavior  Effect No apparent injury  Thought Process  Coherency WDL  Content WDL  Delusions None reported or observed  Perception WDL  Hallucination None reported or observed  Judgment Limited  Confusion None  Danger to Self  Current suicidal ideation? Denies  Agreement Not to Harm Self Yes  Description of Agreement Verbal  Danger to Others  Danger to Others None reported or observed

## 2024-11-19 NOTE — Progress Notes (Signed)
 [facility] MD Progress Note  11/19/2024 3:06 PM Gary Rojas  MRN:  [id number]   Subjective:  Chart reviewed, case discussed in multidisciplinary meeting, patient seen during rounds.   11/24: On interview today, patient is noted to be calm and cooperative, alert and oriented.  He is linear and logical on exam.  Affect is bright.  He reports stable sleep and appetite.  He is tolerating medication regimen well without adverse effects.  He denies SI/HI/plan and denies hallucinations.  He denies current symptoms of depression or anxiety.  He reports upon hospital discharge, he will be living with his parents.  He denies access to guns or other lethal means.  He does not voice any concerns or complaints at this time.  11/23: Is more able to hold a conversation. He is still thought blocking at times and is easily derailed. Denies ongoing headaches and nausea. Denies adverse effects of medication. He is medication compliant. Will increase Abilify  to 10 mg daily, patient is agreeable. He is linear and oriented to self, location, date, and president. He is unclear about the situation at his work that led to admission. He reports sleep, appetite, and mood are stable.   11/22: Patient seen for follow up they declined their morning medication but took it later in the day. Notes he was worried about getting a headache discussed that had noted decreased headaches since switching to abilify  and that staff and his family have noted improvement in his mentation since starting the medication. He was agreeable with continuing the medication. Continue to encourage to perform adls. Spoke with mother who plans to come visit and feels that he is improving. She is agreeable with him moving back in. He denies SI/HI/AVH. Is still experiencing thought blocking but continues to show improvement. Denies Headache and nausea today.   11/21: Patient seen for follow-up he is alert and oriented.  He is more linear today.  He has  been medication compliant he had some nausea overnight but is not reporting nausea or headache today.  Reports he met with his family last night.  He denies SI, HI, and AVH.  He is not required any behavioral PRNs.  He reports stable appetite and sleep.  He voices no concerns or complaints.  Spoke with his father over the phone who indicates he feels patient continues to improve and that they have come up with a plan for him to move back in.  Patient was agreeable with the plan to move back in.  Patient and family are agreeable with outpatient follow-up.  Discussed that we will continue to monitor given recent medications which nausea and is an initial altered mental status.    Past Psychiatric History: see h&P Family History: History reviewed. No pertinent family history. Social History:  Social History   Substance and Sexual Activity  Alcohol Use No     Social History   Substance and Sexual Activity  Drug Use Never    Social History   Socioeconomic History   Marital status: Single    Spouse name: Not on file   Number of children: Not on file   Years of education: Not on file   Highest education level: Not on file  Occupational History   Not on file  Tobacco Use   Smoking status: Never   Smokeless tobacco: Never  Vaping Use   Vaping status: Never Used  Substance and Sexual Activity   Alcohol use: No   Drug use: Never   Sexual activity: Not  on file  Other Topics Concern   Not on file  Social History Narrative   Not on file   Social Drivers of Health   Financial Resource Strain: Low Risk  (04/30/2024)   Received from [facility]   Overall Financial Resource Strain (CARDIA)    Difficulty of Paying Living Expenses: Not hard at all  Food Insecurity: No Food Insecurity (11/14/2024)   Hunger Vital Sign    Worried About Running Out of Food in the Last Year: Never true    Ran Out of Food in the Last Year: Never true  Transportation Needs: No Transportation  Needs (11/14/2024)   PRAPARE - Administrator, Civil Service (Medical): No    Lack of Transportation (Non-Medical): No  Physical Activity: Not on file  Stress: Not on file  Social Connections: Not on file   Past Medical History: History reviewed. No pertinent past medical history. History reviewed. No pertinent surgical history.  Current Medications: Current Facility-Administered Medications  Medication Dose Route Frequency Provider Last Rate Last Admin   acetaminophen  (TYLENOL ) tablet 650 mg  650 mg Oral Q6H PRN McCarty, Artie, MD   650 mg at 11/17/24 2118   alum & mag hydroxide-simeth (MAALOX/MYLANTA) 200-200-20 MG/5ML suspension 30 mL  30 mL Oral Q4H PRN McCarty, Artie, MD       ARIPiprazole  (ABILIFY ) tablet 10 mg  10 mg Oral Daily Millington, Matthew E, PA-C   10 mg at 11/19/24 9157   haloperidol  (HALDOL ) tablet 5 mg  5 mg Oral TID PRN McCarty, Artie, MD       And   diphenhydrAMINE  (BENADRYL ) capsule 50 mg  50 mg Oral TID PRN McCarty, Artie, MD       haloperidol  lactate (HALDOL ) injection 5 mg  5 mg Intramuscular TID PRN McCarty, Artie, MD       And   diphenhydrAMINE  (BENADRYL ) injection 50 mg  50 mg Intramuscular TID PRN McCarty, Artie, MD       And   LORazepam  (ATIVAN ) injection 2 mg  2 mg Intramuscular TID PRN McCarty, Artie, MD       haloperidol  lactate (HALDOL ) injection 10 mg  10 mg Intramuscular TID PRN McCarty, Artie, MD       And   diphenhydrAMINE  (BENADRYL ) injection 50 mg  50 mg Intramuscular TID PRN McCarty, Artie, MD       And   LORazepam  (ATIVAN ) injection 2 mg  2 mg Intramuscular TID PRN McCarty, Artie, MD       hydrOXYzine  (ATARAX ) tablet 25 mg  25 mg Oral TID PRN McCarty, Artie, MD   25 mg at 11/14/24 2110   magnesium  hydroxide (MILK OF MAGNESIA) suspension 30 mL  30 mL Oral Daily PRN McCarty, Artie, MD       traZODone  (DESYREL ) tablet 50 mg  50 mg Oral QHS PRN McCarty, Artie, MD   50 mg at 11/14/24 2111    Lab Results: No results found for this or  any previous visit (from the past 48 hours).  Blood Alcohol level:  Lab Results  Component Value Date   [facility] <15 11/10/2024    Metabolic Disorder Labs: No results found for: HGBA1C, MPG No results found for: PROLACTIN No results found for: CHOL, TRIG, HDL, CHOLHDL, VLDL, LDLCALC  Physical Findings: AIMS:  , ,  ,  ,    CIWA:    COWS:      Psychiatric Specialty Exam:  Presentation  General Appearance:  Casual  Eye Contact: Good  Speech: Normal Rate  Speech Volume: Normal    Mood and Affect  Mood: Euthymic  Affect: Appropriate   Thought Process  Thought Processes: Linear  Orientation:Full (Time, Place and Person)  Thought Content: WDL  Hallucinations: None  Ideas of Reference: None  Suicidal Thoughts: No  Homicidal Thoughts: No   Sensorium  Memory: Immediate Fair  Judgment: Poor  Insight: Poor   Executive Functions  Concentration: Fair  Attention Span: Fair  Recall: Fair  Fund of Knowledge: Fair  Language: Fair  Psychomotor Activity  Psychomotor Activity: Normal  Musculoskeletal: Strength & Muscle Tone: within normal limits Gait & Station: normal Assets  Assets: Desire for Improvement; Housing; Vocational/Educational; Social Support    Physical Exam: Physical Exam Vitals and nursing note reviewed.  HENT:     Head: Atraumatic.  Eyes:     Extraocular Movements: Extraocular movements intact.  Pulmonary:     Effort: Pulmonary effort is normal.  Neurological:     Mental Status: He is alert and oriented to person, place, and time.    Review of Systems  Psychiatric/Behavioral:  Negative for depression, hallucinations, substance abuse and suicidal ideas. The patient is not nervous/anxious and does not have insomnia.    Blood pressure 125/75, pulse 66, temperature 97.9 F (36.6 C), temperature source Oral, resp. rate 20, height 6' (1.829 m), weight 81.6 kg, SpO2 100%. Body mass index is 24.41  kg/m.  Diagnosis: Principal Problem:   Brief psychotic disorder (HCC)   PLAN: Safety and Monitoring:  -- Voluntary admission to inpatient psychiatric unit for safety, stabilization and treatment  -- Daily contact with patient to assess and evaluate symptoms and progress in treatment  -- Patient's case to be discussed in multi-disciplinary team meeting  -- Observation Level : q15 minute checks  -- Vital signs:  q12 hours  -- Precautions: suicide, elopement, and assault -- Encouraged patient to participate in unit milieu and in scheduled group therapies  2. Psychiatric Treatment:  Scheduled Medications:    Continue Abilify  10 mg daily  -- The risks/benefits/side-effects/alternatives to this medication were discussed in detail with the patient and time was given for questions. The patient consents to medication trial.  3. Medical Issues Being Addressed:   No acute concerns extensive medical workup prior to transfer to behavioral health.  4. Discharge Planning:   -- mid week if continues to progress  -- Social work and case management to assist with discharge planning and identification of hospital follow-up needs prior to discharge  -- Estimated LOS: 5-7 days  [employer], PA-C 11/19/2024, 3:06 PM

## 2024-11-19 NOTE — Progress Notes (Signed)
   11/19/24 0900  Psych Admission Type (Psych Patients Only)  Admission Status Involuntary  Psychosocial Assessment  Patient Complaints None  Eye Contact Fair  Facial Expression Other (Comment) (WNL)  Affect Appropriate to circumstance  Speech Soft  Interaction Assertive  Motor Activity Other (Comment) (WNL)  Appearance/Hygiene Unremarkable  Behavior Characteristics Cooperative;Appropriate to situation  Mood Pleasant  Aggressive Behavior  Effect No apparent injury  Thought Process  Coherency WDL  Content WDL  Delusions None reported or observed  Perception WDL  Hallucination None reported or observed  Judgment Impaired  Confusion None  Danger to Self  Current suicidal ideation? Denies  Agreement Not to Harm Self Yes  Description of Agreement verbal  Danger to Others  Danger to Others None reported or observed

## 2024-11-19 NOTE — Group Note (Signed)
 LCSW Group Therapy Note  Group Date: 11/19/2024 Start Time: 1300 End Time: 1400   Type of Therapy and Topic:  Group Therapy: Anger Cues and Responses  Participation Level:  Active   Description of Group:   In this group, patients learned how to recognize the physical, cognitive, emotional, and behavioral responses they have to anger-provoking situations.  They identified a recent time they became angry and how they reacted.  They analyzed how their reaction was possibly beneficial and how it was possibly unhelpful.  The group discussed a variety of healthier coping skills that could help with such a situation in the future.  Focus was placed on how helpful it is to recognize the underlying emotions to our anger, because working on those can lead to a more permanent solution as well as our ability to focus on the important rather than the urgent.  Therapeutic Goals: Patients will remember their last incident of anger and how they felt emotionally and physically, what their thoughts were at the time, and how they behaved. Patients will identify how their behavior at that time worked for them, as well as how it worked against them. Patients will explore possible new behaviors to use in future anger situations. Patients will learn that anger itself is normal and cannot be eliminated, and that healthier reactions can assist with resolving conflict rather than worsening situations.  Summary of Patient Progress:   Patient was active during the group. He shared a recent occurrence exercise and how that has assisted the patient in deescalating.  He demonstrated poor insight into the subject matter, was respectful of peers, and participated throughout the entire session.  Therapeutic Modalities:   Cognitive Behavioral Therapy    Sherryle JINNY Margo, LCSW 11/19/2024  3:14 PM

## 2024-11-19 NOTE — Group Note (Signed)
 Date:  11/19/2024 Time:  5:59 PM  Group Topic/Focus:  Goals Group:   The focus of this group is to help patients establish daily goals to achieve during treatment and discuss how the patient can incorporate goal setting into their daily lives to aide in recovery.    Participation Level:  Active  Participation Quality:  Appropriate  Affect:  Appropriate  Cognitive:  Appropriate  Insight: Appropriate  Engagement in Group:  Engaged  Modes of Intervention:  Activity  Additional Comments:    Gary Rojas 11/19/2024, 5:59 PM

## 2024-11-19 NOTE — [facility] Suicide Risk Assessment (Signed)
 [facility] INPATIENT:  Family/Significant Other Suicide Prevention Education  Suicide Prevention Education:  Education Completed; Debbie Verrilli/mother [phone]), has been identified by the patient as the family member/significant other with whom the patient will be residing, and identified as the person(s) who will aid the patient in the event of a mental health crisis (suicidal ideations/suicide attempt).  With written consent from the patient, the family member/significant other has been provided the following suicide prevention education, prior to the and/or following the discharge of the patient.  The suicide prevention education provided includes the following: Suicide risk factors Suicide prevention and interventions National Suicide Hotline telephone number [facility] assessment telephone number [facility] Emergency Assistance 911 [facility] and/or Residential Mobile Crisis Unit telephone number  Request made of family/significant other to: Remove weapons (e.g., guns, rifles, knives), all items previously/currently identified as safety concern.   Remove drugs/medications (over-the-counter, prescriptions, illicit drugs), all items previously/currently identified as a safety concern.  The family member/significant other verbalizes understanding of the suicide prevention education information provided.  The family member/significant other agrees to remove the items of safety concern listed above.  His mother shared that pt has a mental breakdown at work on Saturday and they called the airport security on him. Pt was brought to the emergency department. Schiff stated that she found out after the fact that pt had been seeking help, inquiring from people that he knew about how to get help for depression. She denied feeling that pt is a danger to himself or anyone else. Mother feels he was using substances in order to cover up his feelings. She denied pt having access to  any weapons.   [name] Fam 11/19/2024, 1:33 PM

## 2024-11-19 NOTE — Group Note (Deleted)
 Date:  11/19/2024 Time:  5:51 PM  Group Topic/Focus:  Building Self Esteem:   The Focus of this group is helping patients become aware of the effects of self-esteem on their lives, the things they and others do that enhance or undermine their self-esteem, seeing the relationship between their level of self-esteem and the choices they make and learning ways to enhance self-esteem.     Participation Level:  {[facility] PARTICIPATION OZCZO:77735}  Participation Quality:  {[facility] PARTICIPATION QUALITY:22265}  Affect:  {[facility] AFFECT:22266}  Cognitive:  {[facility] COGNITIVE:22267}  Insight: {[facility] Insight2:20797}  Engagement in Group:  {[facility] ENGAGEMENT IN HMNLE:77731}  Modes of Intervention:  {[facility] MODES OF INTERVENTION:22269}  Additional Comments:  ***  Gary Rojas Michi Herrmann 11/19/2024, 5:51 PM

## 2024-11-19 NOTE — Group Note (Signed)
 Recreation Therapy Group Note   Group Topic:General Recreation  Group Date: 11/19/2024 Start Time: 1400 End Time: 1455 Facilitators: Celestia Jeoffrey BRAVO, LRT, CTRS Location: Courtyard  Group Description: [employer]. LRT and patients played games of basketball, drew with chalk, and played corn hole while outside in the courtyard while getting fresh air and sunlight. Music was being played in the background. LRT and peers conversed about different games they have played before, what they do in their free time and anything else that is on their minds. LRT encouraged pts to drink water after being outside, sweating and getting their heart rate up.  Goal Area(s) Addressed: Patient will build on frustration tolerance skills. Patients will partake in a competitive play game with peers. Patients will gain knowledge of new leisure interest/hobby.    Affect/Mood: Appropriate   Participation Level: Active and Engaged   Participation Quality: Independent   Behavior: Appropriate, Calm, and Cooperative   Speech/Thought Process: Coherent   Insight: Good   Judgement: Good   Modes of Intervention: Exploration, Music, and Socialization   Patient Response to Interventions:  Attentive, Engaged, and Receptive   Education Outcome:  Acknowledges education   Clinical Observations/Individualized Feedback: Will was active in their participation of session activities and group discussion. Pt interacted well with LRT and peers duration of session.    Plan: Continue to engage patient in RT group sessions 2-3x/week.   Jeoffrey BRAVO Celestia, LRT, CTRS 11/19/2024 4:39 PM

## 2024-11-19 NOTE — Plan of Care (Signed)

## 2024-11-19 NOTE — Plan of Care (Signed)
   Problem: Education: Goal: Emotional status will improve Outcome: Progressing

## 2024-11-20 NOTE — Group Note (Signed)
 [facility] LCSW Group Therapy Note   Group Date: 11/20/2024 Start Time: 1300 End Time: 1400  Type of Therapy/Topic:  Group Therapy:  Feelings about Diagnosis  Participation Level:  Minimal   Description of Group:    This group will allow patients to explore their thoughts and feelings about diagnoses they have received. Patients will be guided to explore their level of understanding and acceptance of these diagnoses. Facilitator will encourage patients to process their thoughts and feelings about the reactions of others to their diagnosis, and will guide patients in identifying ways to discuss their diagnosis with significant others in their lives. This group will be process-oriented, with patients participating in exploration of their own experiences as well as giving and receiving support and challenge from other group members.   Therapeutic Goals: 1. Patient will demonstrate understanding of diagnosis as evidence by identifying two or more symptoms of the disorder:  2. Patient will be able to express two feelings regarding the diagnosis 3. Patient will demonstrate ability to communicate their needs through discussion and/or role plays  Summary of Patient Progress: Patient was present for the entirety of the group process. He was minimally engaged and shared at times. Insight into the topic remains questionable. He appeared open and receptive to feedback from his peers and the facilitator.   Therapeutic Modalities:   Cognitive Behavioral Therapy Brief Therapy Feelings Identification    [name] Fam, LCSW

## 2024-11-20 NOTE — Progress Notes (Signed)
 [facility] MD Progress Note  11/20/2024 5:11 PM Gary Rojas  MRN:  [id number]   Subjective:  Chart reviewed, case discussed in multidisciplinary meeting, patient seen during rounds.   11/25: On interview today, patient is noted be pleasant, calm and cooperative, alert and oriented.  Affect is bright.  Patient reports tolerating medication regimen well without adverse effects.  He remains linear, logical, and future oriented. He is looking forward to spending Thanksgiving with his family.  He denies current symptoms of depression or anxiety.  He reports good sleep and appetite.  He denies SI/HI/plan and denies hallucinations.  He is able to discuss coping skills, stating he enjoys golf, spending time with friends or family, reading the Bible, and being outside.  He is able to discuss support system and crisis resources.  Upon hospital discharge, he will be returning to live with his parents.  He is willing to engage in outpatient psychiatric services.  He denies access to guns or other lethal means.  He does not voice any concerns or complaints at this time.  11/24: On interview today, patient is noted to be calm and cooperative, alert and oriented.  He is linear and logical on exam.  Affect is bright.  He reports stable sleep and appetite.  He is tolerating medication regimen well without adverse effects.  He denies SI/HI/plan and denies hallucinations.  He denies current symptoms of depression or anxiety.  He reports upon hospital discharge, he will be living with his parents.  He denies access to guns or other lethal means.  He does not voice any concerns or complaints at this time.  11/23: Is more able to hold a conversation. He is still thought blocking at times and is easily derailed. Denies ongoing headaches and nausea. Denies adverse effects of medication. He is medication compliant. Will increase Abilify  to 10 mg daily, patient is agreeable. He is linear and oriented to self, location, date,  and president. He is unclear about the situation at his work that led to admission. He reports sleep, appetite, and mood are stable.   11/22: Patient seen for follow up they declined their morning medication but took it later in the day. Notes he was worried about getting a headache discussed that had noted decreased headaches since switching to abilify  and that staff and his family have noted improvement in his mentation since starting the medication. He was agreeable with continuing the medication. Continue to encourage to perform adls. Spoke with mother who plans to come visit and feels that he is improving. She is agreeable with him moving back in. He denies SI/HI/AVH. Is still experiencing thought blocking but continues to show improvement. Denies Headache and nausea today.   11/21: Patient seen for follow-up he is alert and oriented.  He is more linear today.  He has been medication compliant he had some nausea overnight but is not reporting nausea or headache today.  Reports he met with his family last night.  He denies SI, HI, and AVH.  He is not required any behavioral PRNs.  He reports stable appetite and sleep.  He voices no concerns or complaints.  Spoke with his father over the phone who indicates he feels patient continues to improve and that they have come up with a plan for him to move back in.  Patient was agreeable with the plan to move back in.  Patient and family are agreeable with outpatient follow-up.  Discussed that we will continue to monitor given recent medications which  nausea and is an initial altered mental status.    Past Psychiatric History: see h&P Family History: History reviewed. No pertinent family history. Social History:  Social History   Substance and Sexual Activity  Alcohol Use No     Social History   Substance and Sexual Activity  Drug Use Never    Social History   Socioeconomic History   Marital status: Single    Spouse name: Not on file   Number of  children: Not on file   Years of education: Not on file   Highest education level: Not on file  Occupational History   Not on file  Tobacco Use   Smoking status: Never   Smokeless tobacco: Never  Vaping Use   Vaping status: Never Used  Substance and Sexual Activity   Alcohol use: No   Drug use: Never   Sexual activity: Not on file  Other Topics Concern   Not on file  Social History Narrative   Not on file   Social Drivers of Health   Financial Resource Strain: Low Risk  (04/30/2024)   Received from [facility]   Overall Financial Resource Strain (CARDIA)    Difficulty of Paying Living Expenses: Not hard at all  Food Insecurity: No Food Insecurity (11/14/2024)   Hunger Vital Sign    Worried About Running Out of Food in the Last Year: Never true    Ran Out of Food in the Last Year: Never true  Transportation Needs: No Transportation Needs (11/14/2024)   PRAPARE - Administrator, Civil Service (Medical): No    Lack of Transportation (Non-Medical): No  Physical Activity: Not on file  Stress: Not on file  Social Connections: Not on file   Past Medical History: History reviewed. No pertinent past medical history. History reviewed. No pertinent surgical history.  Current Medications: Current Facility-Administered Medications  Medication Dose Route Frequency Provider Last Rate Last Admin   acetaminophen  (TYLENOL ) tablet 650 mg  650 mg Oral Q6H PRN McCarty, Artie, MD   650 mg at 11/17/24 2118   alum & mag hydroxide-simeth (MAALOX/MYLANTA) 200-200-20 MG/5ML suspension 30 mL  30 mL Oral Q4H PRN McCarty, Artie, MD       ARIPiprazole  (ABILIFY ) tablet 10 mg  10 mg Oral Daily Millington, Matthew E, PA-C   10 mg at 11/20/24 0930   haloperidol  (HALDOL ) tablet 5 mg  5 mg Oral TID PRN McCarty, Artie, MD       And   diphenhydrAMINE  (BENADRYL ) capsule 50 mg  50 mg Oral TID PRN McCarty, Artie, MD       haloperidol  lactate (HALDOL ) injection 5 mg  5 mg  Intramuscular TID PRN McCarty, Artie, MD       And   diphenhydrAMINE  (BENADRYL ) injection 50 mg  50 mg Intramuscular TID PRN McCarty, Artie, MD       And   LORazepam  (ATIVAN ) injection 2 mg  2 mg Intramuscular TID PRN McCarty, Artie, MD       haloperidol  lactate (HALDOL ) injection 10 mg  10 mg Intramuscular TID PRN McCarty, Artie, MD       And   diphenhydrAMINE  (BENADRYL ) injection 50 mg  50 mg Intramuscular TID PRN McCarty, Artie, MD       And   LORazepam  (ATIVAN ) injection 2 mg  2 mg Intramuscular TID PRN McCarty, Artie, MD       hydrOXYzine  (ATARAX ) tablet 25 mg  25 mg Oral TID PRN McCarty, Artie, MD  25 mg at 11/14/24 2110   magnesium  hydroxide (MILK OF MAGNESIA) suspension 30 mL  30 mL Oral Daily PRN McCarty, Artie, MD       traZODone  (DESYREL ) tablet 50 mg  50 mg Oral QHS PRN McCarty, Artie, MD   50 mg at 11/14/24 2111    Lab Results: No results found for this or any previous visit (from the past 48 hours).  Blood Alcohol level:  Lab Results  Component Value Date   [facility] <15 11/10/2024    Metabolic Disorder Labs: No results found for: HGBA1C, MPG No results found for: PROLACTIN No results found for: CHOL, TRIG, HDL, CHOLHDL, VLDL, LDLCALC  Physical Findings: AIMS:  , ,  ,  ,    CIWA:    COWS:      Psychiatric Specialty Exam:  Presentation  General Appearance:  Casual  Eye Contact: Good  Speech: Normal Rate  Speech Volume: Normal    Mood and Affect  Mood: Euthymic  Affect: Appropriate   Thought Process  Thought Processes: Linear  Orientation:Full (Time, Place and Person)  Thought Content: WDL  Hallucinations: None  Ideas of Reference: None  Suicidal Thoughts: No  Homicidal Thoughts: No   Sensorium  Memory: Immediate Fair  Judgment: Fair  Insight: Fair   Art Therapist  Concentration: Fair  Attention Span: Fair  Recall: [employer] of Knowledge: Fair  Language: Fair  Psychomotor Activity   Psychomotor Activity: Normal  Musculoskeletal: Strength & Muscle Tone: within normal limits Gait & Station: normal Assets  Assets: Desire for Improvement; Housing; Vocational/Educational; Social Support    Physical Exam: Physical Exam Vitals and nursing note reviewed.  HENT:     Head: Atraumatic.  Eyes:     Extraocular Movements: Extraocular movements intact.  Pulmonary:     Effort: Pulmonary effort is normal.  Neurological:     Mental Status: He is alert and oriented to person, place, and time.    Review of Systems  Psychiatric/Behavioral:  Negative for depression, hallucinations, substance abuse and suicidal ideas. The patient is not nervous/anxious and does not have insomnia.    Blood pressure 108/63, pulse 82, temperature 98 F (36.7 C), temperature source Oral, resp. rate 18, height 6' (1.829 m), weight 81.6 kg, SpO2 99%. Body mass index is 24.41 kg/m.  Diagnosis: Principal Problem:   Brief psychotic disorder (HCC)   PLAN: Safety and Monitoring:  -- Voluntary admission to inpatient psychiatric unit for safety, stabilization and treatment  -- Daily contact with patient to assess and evaluate symptoms and progress in treatment  -- Patient's case to be discussed in multi-disciplinary team meeting  -- Observation Level : q15 minute checks  -- Vital signs:  q12 hours  -- Precautions: suicide, elopement, and assault -- Encouraged patient to participate in unit milieu and in scheduled group therapies  2. Psychiatric Treatment:  Scheduled Medications:    Continue Abilify  10 mg daily  -- The risks/benefits/side-effects/alternatives to this medication were discussed in detail with the patient and time was given for questions. The patient consents to medication trial.  3. Medical Issues Being Addressed:   No acute concerns extensive medical workup prior to transfer to behavioral health.  4. Discharge Planning:   -- Wednesday  -- Social work and case management to  assist with discharge planning and identification of hospital follow-up needs prior to discharge  -- Estimated LOS: 5-7 days  Camelia LITTIE Lukes, PA-C 11/20/2024, 5:11 PM

## 2024-11-20 NOTE — Group Note (Signed)
 Date:  11/20/2024 Time:  8:35 PM  Group Topic/Focus:  Goals Group:   The focus of this group is to help patients establish daily goals to achieve during treatment and discuss how the patient can incorporate goal setting into their daily lives to aide in recovery.    Participation Level:  Active  Participation Quality:  Appropriate and Attentive  Affect:  Appropriate  Cognitive:  Alert and Appropriate  Insight: Appropriate, Good, and Improving  Engagement in Group:  Developing/Improving and Engaged  Modes of Intervention:  Clarification, Discussion, Education, Rapport Building, and Support  Additional Comments:     Valerian Jewel 11/20/2024, 8:35 PM

## 2024-11-20 NOTE — Progress Notes (Signed)
   11/20/24 0100  Psych Admission Type (Psych Patients Only)  Admission Status Involuntary  Psychosocial Assessment  Patient Complaints None  Eye Contact Fair  Facial Expression Other (Comment) (WNL)  Affect Appropriate to circumstance  Speech Soft  Interaction Assertive  Motor Activity Other (Comment) (WNL)  Appearance/Hygiene Unremarkable  Behavior Characteristics Cooperative  Mood Pleasant  Aggressive Behavior  Effect No apparent injury  Thought Process  Coherency WDL  Content WDL  Delusions None reported or observed  Perception WDL  Hallucination None reported or observed  Judgment Impaired  Confusion None  Danger to Self  Current suicidal ideation? Denies  Agreement Not to Harm Self Yes  Description of Agreement verbal  Danger to Others  Danger to Others None reported or observed

## 2024-11-20 NOTE — Progress Notes (Signed)
   11/20/24 2300  Psych Admission Type (Psych Patients Only)  Admission Status Involuntary  Psychosocial Assessment  Patient Complaints None  Eye Contact Fair  Facial Expression Other (Comment) (WDL)  Affect Appropriate to circumstance  Speech Soft  Interaction Assertive  Motor Activity Other (Comment) (WDL)  Appearance/Hygiene Unremarkable  Behavior Characteristics Cooperative;Appropriate to situation  Mood Pleasant  Aggressive Behavior  Effect No apparent injury  Thought Process  Coherency WDL  Content WDL  Delusions None reported or observed  Perception WDL  Hallucination None reported or observed  Judgment Impaired  Confusion None  Danger to Self  Current suicidal ideation? Denies  Agreement Not to Harm Self Yes  Description of Agreement verbal  Danger to Others  Danger to Others None reported or observed

## 2024-11-20 NOTE — Group Note (Signed)
 Date:  11/20/2024 Time:  1:54 AM  Group Topic/Focus:  Emotional Education:   The focus of this group is to discuss what feelings/emotions are, and how they are experienced. Wrap-Up Group:   The focus of this group is to help patients review their daily goal of treatment and discuss progress on daily workbooks.    Participation Level:  Active  Participation Quality:  Appropriate and Attentive  Affect:  Appropriate  Cognitive:  Alert and Appropriate  Insight: Appropriate and Good  Engagement in Group:  Engaged  Modes of Intervention:  Education  Additional Comments:     Arlester CHRISTELLA Servant 11/20/2024, 1:54 AM

## 2024-11-20 NOTE — Group Note (Signed)
 Date:  11/20/2024 Time:  10:04 AM  Group Topic/Focus:  Coping With Mental Health Crisis:   The purpose of this group is to help patients identify strategies for coping with mental health crisis.  Group discusses possible causes of crisis and ways to manage them effectively.    Participation Level:  Active  Participation Quality:  Appropriate  Affect:  Appropriate  Cognitive:  Appropriate  Insight: Appropriate  Engagement in Group:  Engaged  Modes of Intervention:  Discussion  Additional Comments:    Leigh VEAR Pais 11/20/2024, 10:04 AM

## 2024-11-20 NOTE — Group Note (Signed)
 Recreation Therapy Group Note   Group Topic:Coping Skills  Group Date: 11/20/2024 Start Time: 1000 End Time: 1050 Facilitators: Celestia Jeoffrey BRAVO, LRT, CTRS Location: Craft Room  Group Description: Mind Map.  Patient was provided a blank template of a diagram with 32 blank boxes in a tiered system, branching from the center (similar to a bubble chart). LRT directed patients to label the middle of the diagram Coping Skills. LRT and patients then came up with 8 different coping skills as examples. Pt were directed to record their coping skills in the 2nd tier boxes closest to the center.  Patients would then share their coping skills with the group as LRT wrote them out. LRT gave a handout of 99 different coping skills at the end of group.   Goal Area(s) Addressed: Patients will be able to define "coping skills". Patient will identify new coping skills.  Patient will increase communication.   Affect/Mood: Appropriate   Participation Level: Active and Engaged   Participation Quality: Independent   Behavior: Alert and Appropriate   Speech/Thought Process: Coherent   Insight: Good   Judgement: Good and Improved   Modes of Intervention: Clarification, Education, Worksheet, and Writing   Patient Response to Interventions:  Attentive, Engaged, and Receptive   Education Outcome:  Acknowledges education   Clinical Observations/Individualized Feedback: Will was active in their participation of session activities and group discussion. Pt identified talk to someone and laughing as coping skills.    Plan: Continue to engage patient in RT group sessions 2-3x/week.   Jeoffrey BRAVO Celestia, LRT, CTRS 11/20/2024 12:38 PM

## 2024-11-20 NOTE — Plan of Care (Signed)
   Problem: Education: Goal: Emotional status will improve Outcome: Progressing Goal: Mental status will improve Outcome: Progressing

## 2024-11-20 NOTE — Plan of Care (Signed)
  Problem: Activity: Goal: Sleeping patterns will improve Outcome: Progressing   Problem: Coping: Goal: Ability to verbalize frustrations and anger appropriately will improve Outcome: Progressing   Problem: Education: Goal: Emotional status will improve Outcome: Progressing

## 2024-11-20 NOTE — Progress Notes (Signed)
   11/20/24 1400  Psych Admission Type (Psych Patients Only)  Admission Status Involuntary  Psychosocial Assessment  Patient Complaints None  Eye Contact Fair  Facial Expression Anxious  Affect Appropriate to circumstance  Speech Soft  Interaction Assertive  Motor Activity  (WNL)  Appearance/Hygiene Unremarkable  Behavior Characteristics Cooperative  Mood Pleasant  Thought Process  Coherency WDL  Content WDL  Delusions None reported or observed  Perception WDL  Hallucination None reported or observed  Judgment Impaired  Confusion None  Danger to Self  Current suicidal ideation? Denies  Self-Injurious Behavior No self-injurious ideation or behavior indicators observed or expressed   Agreement Not to Harm Self Yes  Description of Agreement Verbal  Danger to Others  Danger to Others None reported or observed

## 2024-11-21 ENCOUNTER — Other Ambulatory Visit: Payer: Self-pay

## 2024-11-21 MED ORDER — ARIPIPRAZOLE 10 MG PO TABS
10.0000 mg | ORAL_TABLET | Freq: Every day | ORAL | 0 refills | Status: AC
  Filled 2024-11-21: qty 30, 30d supply, fill #0

## 2024-11-21 NOTE — Progress Notes (Signed)
 Patient denies SI/HI/AVH at this time. Discharge instructions, AVS, prescriptions, and transition record reviewed with patient. Patient agrees to comply with medication management, follow-up visit and outpatient therapy. Patient belongings returned to patient. Patient questions and concerns addressed and answered.  Patient ambulatory off unit. Patient discharged via family vehicle to home.

## 2024-11-21 NOTE — Plan of Care (Signed)
  Problem: Education: Goal: Knowledge of [facility] Education information/materials will improve Outcome: Adequate for Discharge Goal: Emotional status will improve Outcome: Adequate for Discharge Goal: Mental status will improve Outcome: Adequate for Discharge Goal: Verbalization of understanding the information provided will improve Outcome: Adequate for Discharge   Problem: Activity: Goal: Interest or engagement in activities will improve Outcome: Adequate for Discharge Goal: Sleeping patterns will improve Outcome: Adequate for Discharge   Problem: Health Behavior/Discharge Planning: Goal: Identification of resources available to assist in meeting health care needs will improve Outcome: Adequate for Discharge Goal: Compliance with treatment plan for underlying cause of condition will improve Outcome: Adequate for Discharge   Problem: Physical Regulation: Goal: Ability to maintain clinical measurements within normal limits will improve Outcome: Adequate for Discharge   Problem: Safety: Goal: Periods of time without injury will increase Outcome: Adequate for Discharge

## 2024-11-21 NOTE — BHH Suicide Risk Assessment (Signed)
 [facility] Discharge Suicide Risk Assessment   Principal Problem: Brief psychotic disorder [facility]) Discharge Diagnoses: Principal Problem:   Brief psychotic disorder (HCC)   Total Time spent with patient: 30 minutes  Musculoskeletal: Strength & Muscle Tone: within normal limits Gait & Station: normal Patient leans: N/A  Psychiatric Specialty Exam  Presentation  General Appearance:  Appropriate for Environment  Eye Contact: Good  Speech: Clear and Coherent  Speech Volume: Normal  Handedness:No data recorded  Mood and Affect  Mood: Euthymic  Duration of Depression Symptoms: No data recorded Affect: Appropriate   Thought Process  Thought Processes: Coherent; Linear  Descriptions of Associations:Intact  Orientation:Full (Time, Place and Person)  Thought Content:WDL  History of Schizophrenia/Schizoaffective disorder:No  Duration of Psychotic Symptoms:Less than six months  Hallucinations:Hallucinations: None  Ideas of Reference:None  Suicidal Thoughts:Suicidal Thoughts: No  Homicidal Thoughts:Homicidal Thoughts: No   Sensorium  Memory: Immediate Fair; Recent Fair  Judgment: Fair  Insight: Fair   Art Therapist  Concentration: Fair  Attention Span: Fair  Recall: [employer] of Knowledge: Fair  Language: Fair   Psychomotor Activity  Psychomotor Activity: Psychomotor Activity: Normal   Assets  Assets: Communication Skills; Desire for Improvement; Housing; Social Support; Vocational/Educational   Sleep  Sleep: Sleep: Good  Estimated Sleeping Duration (Last 24 Hours): 6.25-7.50 hours  Physical Exam: Physical Exam ROS Blood pressure 116/74, pulse 77, temperature (!) 97.4 F (36.3 C), temperature source Oral, resp. rate 16, height 6' (1.829 m), weight 81.6 kg, SpO2 100%. Body mass index is 24.41 kg/m.  Mental Status Per Nursing Assessment::   On Admission:  NA  Demographic Factors:  Male and Adolescent or young  adult  Loss Factors: NA  Historical Factors: NA  Risk Reduction Factors:   Employed, Living with another person, especially a relative, Positive social support, and Positive coping skills or problem solving skills  Continued Clinical Symptoms:  Substance use  Cognitive Features That Contribute To Risk:  None    Suicide Risk:  Minimal: No identifiable suicidal ideation.  Patients presenting with no risk factors but with morbid ruminations; may be classified as minimal risk based on the severity of the depressive symptoms   Follow-up Information     [facility] Follow up.   Why: Your appointment is scheduled for Friday, 11/23/24 at 10 AM. Contact information: [address] [place] [address] [phone]                 Plan Of Care/Follow-up recommendations:  Activity:  as tolerated   Camelia LITTIE Lukes, PA-C 11/21/2024, 10:31 AM

## 2024-11-21 NOTE — Progress Notes (Signed)
  [facility] Adult Case Management Discharge Plan :  Will you be returning to the same living situation after discharge:  Yes,  pt plans to return home upon discharge.  At discharge, do you have transportation home?: Yes,  father to provide transportation.  Do you have the ability to pay for your medications: Yes,  BLUE CROSS BLUE SHIELD / BC[employer] COMM PPO  Release of information consent forms completed and in the chart;  Patient's signature needed at discharge.  Patient to Follow up at:  Follow-up Information     [facility]  Follow up.   Why: Your appointment is scheduled for Friday, 11/23/24 at 10 AM. Contact information: [address] [place] [address] [phone]                 Next level of care provider has access to [facility] Link:no  Safety Planning and Suicide Prevention discussed: Yes,  SPE completed with mother, Dent Plantz.      Has patient been referred to the Quitline?: Patient does not use tobacco/nicotine products  Patient has been referred for addiction treatment: Yes, the patient will follow up with an outpatient provider for substance use disorder. Psychiatrist/APP: appointment made and Therapist: appointment made  [name] Fam, LCSW 11/21/2024, 9:23 AM

## 2024-11-21 NOTE — Plan of Care (Signed)
   Problem: Education: Goal: Knowledge of Gary Rojas General Education information/materials will improve Outcome: Progressing Goal: Emotional status will improve Outcome: Progressing Goal: Mental status will improve Outcome: Progressing Goal: Verbalization of understanding the information provided will improve Outcome: Progressing   Problem: Activity: Goal: Interest or engagement in activities will improve Outcome: Progressing Goal: Sleeping patterns will improve Outcome: Progressing   Problem: Coping: Goal: Ability to verbalize frustrations and anger appropriately will improve Outcome: Progressing Goal: Ability to demonstrate self-control will improve Outcome: Progressing

## 2024-11-23 ENCOUNTER — Other Ambulatory Visit: Payer: Self-pay

## 2024-11-24 NOTE — Discharge Summary (Signed)
 Physician Discharge Summary Note  Patient:  Gary Rojas is an 23 y.o., male MRN:  [id number] DOB:  [birth year] Patient phone:  [phone] (home)  Patient address:   [address] [place] [address],   Total time spent: 40 min Date of Admission:  11/14/2024 Date of Discharge: 11/21/2024  Reason for Admission:  Altered mental status, psychotic symptoms  Principal Problem: Brief psychotic disorder [facility]) Discharge Diagnoses: Principal Problem:   Brief psychotic disorder (HCC)   Past Psychiatric History: see h&p  Family Psychiatric  History: see h&p Social History:  Social History   Substance and Sexual Activity  Alcohol Use No     Social History   Substance and Sexual Activity  Drug Use Never    Social History   Socioeconomic History   Marital status: Single    Spouse name: Not on file   Number of children: Not on file   Years of education: Not on file   Highest education level: Not on file  Occupational History   Not on file  Tobacco Use   Smoking status: Never   Smokeless tobacco: Never  Vaping Use   Vaping status: Never Used  Substance and Sexual Activity   Alcohol use: No   Drug use: Never   Sexual activity: Not on file  Other Topics Concern   Not on file  Social History Narrative   Not on file   Social Drivers of Health   Financial Resource Strain: Low Risk  (04/30/2024)   Received from [facility]   Overall Financial Resource Strain (CARDIA)    Difficulty of Paying Living Expenses: Not hard at all  Food Insecurity: No Food Insecurity (11/14/2024)   Hunger Vital Sign    Worried About Running Out of Food in the Last Year: Never true    Ran Out of Food in the Last Year: Never true  Transportation Needs: No Transportation Needs (11/14/2024)   PRAPARE - Administrator, Civil Service (Medical): No    Lack of Transportation (Non-Medical): No  Physical Activity: Not on file  Stress: Not on file  Social  Connections: Not on file   Past Medical History: History reviewed. No pertinent past medical history. History reviewed. No pertinent surgical history. Family History: History reviewed. No pertinent family history.  Hospital Course:    Throughout hospitalization, the patient demonstrated steady improvement in thought organization, mood stability, and engagement in treatment. He remained medication-compliant, agreeing to and tolerating an increase in aripiprazole  to 10 mg daily without adverse effects. Over successive days, he became increasingly linear, logical, and interactive, reporting stable sleep, appetite, and mood while consistently denying suicidal ideation, homicidal ideation, auditory or visual hallucinations, or significant anxiety or depression. Family members reported noticeable improvement and collaborated in discharge planning, agreeing to support his transition back into their home. Family denied safety concerns. Patient maintained safe behaviors on the unit. By discharge, patient was linear and logical, bright in affect, future-oriented, able to discuss coping skills, support system, and crisis resources, and denied access to lethal means, culminating in a safe and appropriate discharge with outpatient follow-up.  Detailed risk assessment is complete based on clinical exam and individual risk factors and acute suicide risk is low and acute violence risk is low.    On the day of discharge, patient denies SI/HI/plan and denies hallucinations.  Patient remains future oriented and is willing to participate in outpatient mental health services.  Currently, all modifiable risk of harm to self/harm to others  have been addressed and patient is no longer appropriate for the acute inpatient setting and is able to continue treatment for mental health needs in the community with the supports as indicated below.  Patient is educated and verbalized understanding of discharge plan of care including  medications, follow-up appointments, mental health resources and further crisis services in the community.  He is instructed to call 911 or present to the nearest emergency room should he experience any decompensation in mood, disturbance of bowel or return of suicidal/homicidal ideations.  Patient verbalizes understanding of this education and agrees to this plan of care  Physical Findings: AIMS:  , ,  ,  ,    CIWA:    COWS:      Psychiatric Specialty Exam:  Presentation  General Appearance:  Appropriate for Environment  Eye Contact: Good  Speech: Clear and Coherent  Speech Volume: Normal    Mood and Affect  Mood: Euthymic  Affect: Appropriate   Thought Process  Thought Processes: Coherent; Linear  Descriptions of Associations:Intact  Orientation:Full (Time, Place and Person)  Thought Content:WDL  Hallucinations: None Ideas of Reference:None  Suicidal Thoughts: No Homicidal Thoughts: No  Sensorium  Memory: Immediate Fair; Recent Fair  Judgment: Fair  Insight: Fair   Art Therapist  Concentration: Fair  Attention Span: Fair  Recall: [employer] of Knowledge: Fair  Language: Fair   Psychomotor Activity  Psychomotor Activity: Normal Musculoskeletal: Strength & Muscle Tone: within normal limits Gait & Station: normal Assets  Assets: Manufacturing Systems Engineer; Desire for Improvement; Housing; Social Support; Vocational/Educational   Sleep  Sleep:No data recorded   Physical Exam: Physical Exam Vitals and nursing note reviewed.  Constitutional:      Appearance: Normal appearance.  HENT:     Head: Normocephalic and atraumatic.     Nose: Nose normal.  Eyes:     Conjunctiva/sclera: Conjunctivae normal.  Pulmonary:     Effort: Pulmonary effort is normal.  Neurological:     Mental Status: He is alert and oriented to person, place, and time.  Psychiatric:        Attention and Perception: Attention and perception normal. He  does not perceive auditory or visual hallucinations.        Mood and Affect: Mood and affect normal.        Speech: Speech normal.        Behavior: Behavior is cooperative.        Thought Content: Thought content normal. Thought content is not paranoid or delusional. Thought content does not include homicidal or suicidal ideation. Thought content does not include homicidal or suicidal plan.        Cognition and Memory: Cognition normal.        Judgment: Judgment normal.    Review of Systems  Psychiatric/Behavioral:  Negative for depression, hallucinations and suicidal ideas. The patient is not nervous/anxious and does not have insomnia.    Blood pressure 116/74, pulse 77, temperature (!) 97.4 F (36.3 C), temperature source Oral, resp. rate 16, height 6' (1.829 m), weight 81.6 kg, SpO2 100%. Body mass index is 24.41 kg/m.   Social History   Tobacco Use  Smoking Status Never  Smokeless Tobacco Never   Tobacco Cessation:  N/A, patient does not currently use tobacco products   Blood Alcohol level:  Lab Results  Component Value Date   [facility] <15 11/10/2024    Metabolic Disorder Labs:  No results found for: HGBA1C, MPG No results found for: PROLACTIN No results found for: CHOL, TRIG,  HDL, CHOLHDL, VLDL, LDLCALC  See Psychiatric Specialty Exam and Suicide Risk Assessment completed by Attending Physician prior to discharge.  Discharge destination:  Home  Is patient on multiple antipsychotic therapies at discharge:  No   Has Patient had three or more failed trials of antipsychotic monotherapy by history:  No  Recommended Plan for Multiple Antipsychotic Therapies: NA  Discharge Instructions     Diet - low sodium heart healthy   Complete by: As directed    Increase activity slowly   Complete by: As directed       Allergies as of 11/21/2024   No Known Allergies      Medication List     STOP taking these medications    acetaminophen  325 MG  tablet Commonly known as: TYLENOL    thiamine  100 MG tablet Commonly known as: VITAMIN B1       TAKE these medications      Indication  ARIPiprazole  10 MG tablet Commonly known as: ABILIFY  Take 1 tablet (10 mg total) by mouth daily.  Indication: Psychotic disorder        Follow-up Information     [facility] Follow up.   Why: Your appointment is scheduled for Friday, 11/23/24 at 10 AM. Contact information: [address] [place] [address] [phone]                 Follow-up recommendations:  Activity:  as tolerated    Signed: Camelia LITTIE Lukes, PA-C 11/21/2024, 11:16 AM

## 2024-12-02 LAB — CULTURE, FUNGUS WITHOUT SMEAR: Culture: NO GROWTH
